# Patient Record
Sex: Female | Born: 1965
Health system: Southern US, Community
[De-identification: ages and names within clinical notes are randomized; demographics above are authoritative.]

## PROBLEM LIST (undated history)

## (undated) DIAGNOSIS — R112 Nausea with vomiting, unspecified: Secondary | ICD-10-CM

## (undated) DIAGNOSIS — M1990 Unspecified osteoarthritis, unspecified site: Secondary | ICD-10-CM

## (undated) DIAGNOSIS — C801 Malignant (primary) neoplasm, unspecified: Secondary | ICD-10-CM

## (undated) DIAGNOSIS — Z9889 Other specified postprocedural states: Secondary | ICD-10-CM

## (undated) HISTORY — PX: ABDOMINAL HYSTERECTOMY: SHX81

## (undated) HISTORY — PX: TUBAL LIGATION: SHX77

## (undated) HISTORY — PX: OTHER SURGICAL HISTORY: SHX169

## (undated) HISTORY — PX: KNEE ARTHROSCOPY: SUR90

---

## 1998-08-18 ENCOUNTER — Inpatient Hospital Stay (HOSPITAL_COMMUNITY): Admission: EM | Admit: 1998-08-18 | Discharge: 1998-08-22 | Payer: Self-pay | Admitting: Rheumatology

## 1998-08-19 ENCOUNTER — Encounter: Payer: Self-pay | Admitting: Rheumatology

## 1998-12-02 ENCOUNTER — Other Ambulatory Visit: Admission: RE | Admit: 1998-12-02 | Discharge: 1998-12-02 | Payer: Self-pay | Admitting: *Deleted

## 1999-02-08 ENCOUNTER — Inpatient Hospital Stay (HOSPITAL_COMMUNITY): Admission: RE | Admit: 1999-02-08 | Discharge: 1999-02-10 | Payer: Self-pay | Admitting: *Deleted

## 2000-01-08 ENCOUNTER — Other Ambulatory Visit: Admission: RE | Admit: 2000-01-08 | Discharge: 2000-01-08 | Payer: Self-pay | Admitting: *Deleted

## 2003-07-26 ENCOUNTER — Other Ambulatory Visit: Admission: RE | Admit: 2003-07-26 | Discharge: 2003-07-26 | Payer: Self-pay | Admitting: *Deleted

## 2004-07-25 ENCOUNTER — Other Ambulatory Visit: Admission: RE | Admit: 2004-07-25 | Discharge: 2004-07-25 | Payer: Self-pay | Admitting: *Deleted

## 2005-07-17 ENCOUNTER — Other Ambulatory Visit: Admission: RE | Admit: 2005-07-17 | Discharge: 2005-07-17 | Payer: Self-pay | Admitting: *Deleted

## 2006-04-26 ENCOUNTER — Ambulatory Visit: Payer: Self-pay | Admitting: Endocrinology

## 2006-06-17 ENCOUNTER — Ambulatory Visit: Payer: Self-pay | Admitting: Endocrinology

## 2006-07-03 ENCOUNTER — Ambulatory Visit: Payer: Self-pay | Admitting: Endocrinology

## 2006-07-17 ENCOUNTER — Other Ambulatory Visit: Admission: RE | Admit: 2006-07-17 | Discharge: 2006-07-17 | Payer: Self-pay | Admitting: *Deleted

## 2006-08-02 ENCOUNTER — Ambulatory Visit: Payer: Self-pay | Admitting: Endocrinology

## 2007-02-27 HISTORY — PX: FOOT SURGERY: SHX648

## 2007-05-29 ENCOUNTER — Encounter: Payer: Self-pay | Admitting: Endocrinology

## 2007-05-29 DIAGNOSIS — M069 Rheumatoid arthritis, unspecified: Secondary | ICD-10-CM | POA: Insufficient documentation

## 2007-05-29 DIAGNOSIS — E0590 Thyrotoxicosis, unspecified without thyrotoxic crisis or storm: Secondary | ICD-10-CM | POA: Insufficient documentation

## 2007-08-04 ENCOUNTER — Other Ambulatory Visit: Admission: RE | Admit: 2007-08-04 | Discharge: 2007-08-04 | Payer: Self-pay | Admitting: *Deleted

## 2008-08-11 ENCOUNTER — Other Ambulatory Visit: Admission: RE | Admit: 2008-08-11 | Discharge: 2008-08-11 | Payer: Self-pay | Admitting: Gynecology

## 2011-03-16 NOTE — Consult Note (Signed)
[facility]                            ENDOCRINOLOGY CONSULTATION   VONNIE, SPAGNOLO                        MRN:          [id number]  DATE:06/17/2006                            DOB:          06-25-1966    REASON FOR VISIT:  Follow-up thyroid.   HISTORY OF PRESENT ILLNESS:  A 45 year old old woman recently seen for  hyperthyroidism.  She states she is feeling better.  She has not had any  recent flu-like illness.  She has gained several pounds since her last  visit.   PAST MEDICAL HISTORY:  She has not recently had any iodinated contrast  agent.   REVIEW OF SYSTEMS:  She denies neck pain.   PHYSICAL EXAMINATION:  VITAL SIGNS:  Blood pressure is 105/68.  Heart rate  67.  Temperature is 97.6.  The weight is 163.  GENERAL:  No distress.  SKIN:  Not diaphoretic.  EYES:  No proptosis, no periorbital swelling.  NECK:  The thyroid is normal to my examination.   LABORATORY STUDIES:  Thyroid nuclear medicine scan done at [facility]  on May 21, 2006 shows a 24-hour uptake of 0.7%.  Thyroid function studies  on June 17, 2006:  TSH normal at 3.04.  Free T4 low normal at 0.6.   IMPRESSION:  1. Subacute (viral) thyroiditis.  2. In view of #1, it seems that she has gone from her hyperthyroid to her      euthyroid phase and will soon develop hypothyroidism.   PLAN:  I left a message for the patient suggesting she come back in a few  weeks.                                   Sean A. Everardo All, MD   SAE/MedQ  DD:  06/19/2006  DT:  06/19/2006  Job #:  [id number]   cc:   Aundra Dubin, MD  Almedia Balls. Randell Patient, MD

## 2014-03-01 ENCOUNTER — Other Ambulatory Visit: Payer: Self-pay | Admitting: Orthopedic Surgery

## 2014-03-16 ENCOUNTER — Encounter (HOSPITAL_COMMUNITY): Payer: Self-pay | Admitting: Pharmacy Technician

## 2014-03-19 ENCOUNTER — Encounter (HOSPITAL_COMMUNITY)
Admission: RE | Admit: 2014-03-19 | Discharge: 2014-03-19 | Disposition: A | Discharge status: Home / Self Care | Payer: Medicare Other | Source: Ambulatory Visit | Attending: Orthopedic Surgery | Admitting: Orthopedic Surgery

## 2014-03-19 ENCOUNTER — Ambulatory Visit (HOSPITAL_COMMUNITY)
Admission: RE | Admit: 2014-03-19 | Discharge: 2014-03-19 | Disposition: A | Discharge status: Home / Self Care | Payer: Medicare Other | Source: Ambulatory Visit | Attending: Orthopedic Surgery | Admitting: Orthopedic Surgery

## 2014-03-19 ENCOUNTER — Encounter (HOSPITAL_COMMUNITY): Payer: Self-pay

## 2014-03-19 DIAGNOSIS — Z01818 Encounter for other preprocedural examination: Secondary | ICD-10-CM | POA: Insufficient documentation

## 2014-03-19 DIAGNOSIS — Z01810 Encounter for preprocedural cardiovascular examination: Secondary | ICD-10-CM | POA: Insufficient documentation

## 2014-03-19 DIAGNOSIS — Z01812 Encounter for preprocedural laboratory examination: Secondary | ICD-10-CM | POA: Insufficient documentation

## 2014-03-19 HISTORY — DX: Other specified postprocedural states: Z98.890

## 2014-03-19 HISTORY — DX: Malignant (primary) neoplasm, unspecified: C80.1

## 2014-03-19 HISTORY — DX: Unspecified osteoarthritis, unspecified site: M19.90

## 2014-03-19 HISTORY — DX: Nausea with vomiting, unspecified: R11.2

## 2014-03-19 LAB — URINALYSIS, ROUTINE W REFLEX MICROSCOPIC
Bilirubin Urine: NEGATIVE
Glucose, UA: NEGATIVE mg/dL
Ketones, ur: NEGATIVE mg/dL
NITRITE: NEGATIVE
Protein, ur: NEGATIVE mg/dL
SPECIFIC GRAVITY, URINE: 1.009 (ref 1.005–1.030)
Urobilinogen, UA: 0.2 mg/dL (ref 0.0–1.0)
pH: 6 (ref 5.0–8.0)

## 2014-03-19 LAB — CBC WITH DIFFERENTIAL/PLATELET
BASOS PCT: 1 % (ref 0–1)
Basophils Absolute: 0 10*3/uL (ref 0.0–0.1)
EOS ABS: 0.1 10*3/uL (ref 0.0–0.7)
EOS PCT: 2 % (ref 0–5)
HCT: 37.3 % (ref 36.0–46.0)
HEMOGLOBIN: 12.4 g/dL (ref 12.0–15.0)
Lymphocytes Relative: 20 % (ref 12–46)
Lymphs Abs: 1.2 10*3/uL (ref 0.7–4.0)
MCH: 32.3 pg (ref 26.0–34.0)
MCHC: 33.2 g/dL (ref 30.0–36.0)
MCV: 97.1 fL (ref 78.0–100.0)
MONOS PCT: 7 % (ref 3–12)
Monocytes Absolute: 0.4 10*3/uL (ref 0.1–1.0)
NEUTROS PCT: 70 % (ref 43–77)
Neutro Abs: 4.1 10*3/uL (ref 1.7–7.7)
Platelets: 293 10*3/uL (ref 150–400)
RBC: 3.84 MIL/uL — ABNORMAL LOW (ref 3.87–5.11)
RDW: 14.5 % (ref 11.5–15.5)
WBC: 5.8 10*3/uL (ref 4.0–10.5)

## 2014-03-19 LAB — URINE MICROSCOPIC-ADD ON

## 2014-03-19 LAB — COMPREHENSIVE METABOLIC PANEL
ALBUMIN: 3.7 g/dL (ref 3.5–5.2)
ALK PHOS: 85 U/L (ref 39–117)
ALT: 17 U/L (ref 0–35)
AST: 22 U/L (ref 0–37)
BUN: 9 mg/dL (ref 6–23)
CALCIUM: 9.6 mg/dL (ref 8.4–10.5)
CO2: 24 mEq/L (ref 19–32)
CREATININE: 0.68 mg/dL (ref 0.50–1.10)
Chloride: 98 mEq/L (ref 96–112)
GFR calc non Af Amer: >90 mL/min (ref >90)
Glucose, Bld: 77 mg/dL (ref 70–99)
POTASSIUM: 4.2 meq/L (ref 3.7–5.3)
Sodium: 137 mEq/L (ref 137–147)
TOTAL PROTEIN: 7.7 g/dL (ref 6.0–8.3)
Total Bilirubin: <0.2 mg/dL — ABNORMAL LOW (ref 0.3–1.2)

## 2014-03-19 LAB — PROTIME-INR
INR: 0.93 (ref 0.00–1.49)
PROTHROMBIN TIME: 12.3 s (ref 11.6–15.2)

## 2014-03-19 LAB — TYPE AND SCREEN
ABO/RH(D): A POS
ANTIBODY SCREEN: NEGATIVE

## 2014-03-19 LAB — SURGICAL PCR SCREEN
MRSA, PCR: NEGATIVE
STAPHYLOCOCCUS AUREUS: POSITIVE — AB

## 2014-03-19 LAB — APTT: aPTT: 30 seconds (ref 24–37)

## 2014-03-19 LAB — ABO/RH: ABO/RH(D): A POS

## 2014-03-19 NOTE — Pre-Procedure Instructions (Addendum)
Jacqueline [name]  03/19/2014   Your procedure is scheduled on: 6.1.15  Report to [place] short stay admitting at 530 AM.  Call this number if you have problems the morning of surgery: [phone]   Remember:   Do not eat food or drink liquids after midnight.   Take these medicines the morning of surgery with A SIP OF WATER: , pain med if needed         Take all reqular meds until day of surgery except as below or per dr         Jacqueline [name] all herbel meds, nsaids (aleve,naproxen,advil,ibuprofen) 5 days prior to surgery (5.27.15) including all vitamins,folic acid, methotrexate,niacin   Do not wear jewelry, make-up or nail polish.  Do not wear lotions, powders, or perfumes. You may wear deodorant.  Do not shave 48 hours prior to surgery. Men may shave face and neck.  Do not bring valuables to the hospital.  [facility] is not responsible                  for any belongings or valuables.               Contacts, dentures or bridgework may not be worn into surgery.  Leave suitcase in the car. After surgery it may be brought to your room.  For patients admitted to the hospital, discharge time is determined by your                treatment team.               Patients discharged the day of surgery will not be allowed to drive  home.  Name and phone number of your driver:   Special Instructions:  Special Instructions: [place] - Preparing for Surgery  Before surgery, you can play an important role.  Because skin is not sterile, your skin needs to be as free of germs as possible.  You can reduce the number of germs on you skin by washing with CHG (chlorahexidine gluconate) soap before surgery.  CHG is an antiseptic cleaner which kills germs and bonds with the skin to continue killing germs even after washing.  Please DO NOT use if you have an allergy to CHG or antibacterial soaps.  If your skin becomes reddened/irritated stop using the CHG and inform your nurse when you arrive at Short  Stay.  Do not shave (including legs and underarms) for at least 48 hours prior to the first CHG shower.  You may shave your face.  Please follow these instructions carefully:   1.  Shower with CHG Soap the night before surgery and the morning of Surgery.  2.  If you choose to wash your hair, wash your hair first as usual with your normal shampoo.  3.  After you shampoo, rinse your hair and body thoroughly to remove the Shampoo.  4.  Use CHG as you would any other liquid soap.  You can apply chg directly  to the skin and wash gently with scrungie or a clean washcloth.  5.  Apply the CHG Soap to your body ONLY FROM THE NECK DOWN.  Do not use on open wounds or open sores.  Avoid contact with your eyes ears, mouth and genitals (private parts).  Wash genitals (private parts)       with your normal soap.  6.  Wash thoroughly, paying special attention to the area where your surgery will be performed.  7.  Thoroughly rinse  your body with warm water from the neck down.  8.  DO NOT shower/wash with your normal soap after using and rinsing off the CHG Soap.  9.  Pat yourself dry with a clean towel.            10.  Wear clean pajamas.            11.  Place clean sheets on your bed the night of your first shower and do not sleep with pets.  Day of Surgery  Do not apply any lotions/deodorants the morning of surgery.  Please wear clean clothes to the hospital/surgery center.   Please read over the following fact sheets that you were given: Pain Booklet, Coughing and Deep Breathing, Blood Transfusion Information, Total Joint Packet, MRSA Information and Surgical Site Infection Prevention

## 2014-03-21 LAB — URINE CULTURE

## 2014-03-28 MED ORDER — TRANEXAMIC ACID 100 MG/ML IV SOLN
1000.0000 mg | INTRAVENOUS | Status: AC
  Administered 2014-03-29: 1000 mg via INTRAVENOUS
  Filled 2014-03-28: qty 10

## 2014-03-28 MED ORDER — CEFAZOLIN SODIUM-DEXTROSE 2-3 GM-% IV SOLR
2.0000 g | INTRAVENOUS | Status: AC
  Administered 2014-03-29: 2 g via INTRAVENOUS
  Filled 2014-03-28: qty 50

## 2014-03-29 ENCOUNTER — Inpatient Hospital Stay (HOSPITAL_COMMUNITY)
Admission: RE | Admit: 2014-03-29 | Discharge: 2014-03-30 | DRG: 470 | Disposition: A | Discharge status: Home Health | Payer: Medicare Other | Source: Ambulatory Visit | Attending: Orthopedic Surgery | Admitting: Orthopedic Surgery

## 2014-03-29 ENCOUNTER — Encounter (HOSPITAL_COMMUNITY): Payer: Self-pay | Admitting: Surgery

## 2014-03-29 ENCOUNTER — Encounter (HOSPITAL_COMMUNITY): Payer: Medicare Other | Admitting: Certified Registered"

## 2014-03-29 ENCOUNTER — Encounter (HOSPITAL_COMMUNITY)
Admission: RE | Disposition: A | Discharge status: Home Health | Payer: Self-pay | Source: Ambulatory Visit | Attending: Orthopedic Surgery

## 2014-03-29 ENCOUNTER — Inpatient Hospital Stay (HOSPITAL_COMMUNITY): Payer: Medicare Other | Admitting: Certified Registered"

## 2014-03-29 DIAGNOSIS — Z79899 Other long term (current) drug therapy: Secondary | ICD-10-CM

## 2014-03-29 DIAGNOSIS — Z85820 Personal history of malignant melanoma of skin: Secondary | ICD-10-CM

## 2014-03-29 DIAGNOSIS — M1710 Unilateral primary osteoarthritis, unspecified knee: Principal | ICD-10-CM | POA: Diagnosis present

## 2014-03-29 DIAGNOSIS — Z96659 Presence of unspecified artificial knee joint: Secondary | ICD-10-CM

## 2014-03-29 DIAGNOSIS — D62 Acute posthemorrhagic anemia: Secondary | ICD-10-CM | POA: Diagnosis not present

## 2014-03-29 DIAGNOSIS — M069 Rheumatoid arthritis, unspecified: Secondary | ICD-10-CM | POA: Diagnosis present

## 2014-03-29 DIAGNOSIS — E0590 Thyrotoxicosis, unspecified without thyrotoxic crisis or storm: Secondary | ICD-10-CM | POA: Diagnosis present

## 2014-03-29 HISTORY — PX: TOTAL KNEE ARTHROPLASTY: SHX125

## 2014-03-29 LAB — CBC
HCT: 31 % — ABNORMAL LOW (ref 36.0–46.0)
Hemoglobin: 10.2 g/dL — ABNORMAL LOW (ref 12.0–15.0)
MCH: 31.9 pg (ref 26.0–34.0)
MCHC: 32.9 g/dL (ref 30.0–36.0)
MCV: 96.9 fL (ref 78.0–100.0)
PLATELETS: 383 10*3/uL (ref 150–400)
RBC: 3.2 MIL/uL — AB (ref 3.87–5.11)
RDW: 14 % (ref 11.5–15.5)
WBC: 8.6 10*3/uL (ref 4.0–10.5)

## 2014-03-29 LAB — CREATININE, SERUM
Creatinine, Ser: 0.64 mg/dL (ref 0.50–1.10)
GFR calc Af Amer: >90 mL/min (ref >90)
GFR calc non Af Amer: >90 mL/min (ref >90)

## 2014-03-29 SURGERY — ARTHROPLASTY, KNEE, TOTAL
Anesthesia: Regional | Site: Knee | Laterality: Left

## 2014-03-29 MED ORDER — FLEET ENEMA 7-19 GM/118ML RE ENEM: 1.0000 | ENEMA | Freq: Once | RECTAL | Status: AC | PRN

## 2014-03-29 MED ORDER — METOCLOPRAMIDE HCL 10 MG PO TABS
5.0000 mg | ORAL_TABLET | Freq: Three times a day (TID) | ORAL | Status: DC | PRN
  Administered 2014-03-29: 10 mg via ORAL
  Filled 2014-03-29: qty 1

## 2014-03-29 MED ORDER — CELECOXIB 200 MG PO CAPS
200.0000 mg | ORAL_CAPSULE | Freq: Two times a day (BID) | ORAL | Status: DC
  Administered 2014-03-29 – 2014-03-30 (×3): 200 mg via ORAL
  Filled 2014-03-29 (×6): qty 1

## 2014-03-29 MED ORDER — LIDOCAINE HCL (CARDIAC) 20 MG/ML IV SOLN
INTRAVENOUS | Status: AC
  Filled 2014-03-29: qty 5

## 2014-03-29 MED ORDER — TERBINAFINE HCL 250 MG PO TABS
250.0000 mg | ORAL_TABLET | Freq: Every day | ORAL | Status: DC
Start: 2014-03-29 — End: 2014-03-30
  Administered 2014-03-30: 250 mg via ORAL
  Filled 2014-03-29 (×2): qty 1

## 2014-03-29 MED ORDER — ONDANSETRON HCL 4 MG/2ML IJ SOLN
INTRAMUSCULAR | Status: AC
  Filled 2014-03-29: qty 2

## 2014-03-29 MED ORDER — DOCUSATE SODIUM 100 MG PO CAPS
100.0000 mg | ORAL_CAPSULE | Freq: Two times a day (BID) | ORAL | Status: DC
  Administered 2014-03-29 – 2014-03-30 (×3): 100 mg via ORAL
  Filled 2014-03-29 (×3): qty 1

## 2014-03-29 MED ORDER — FENTANYL CITRATE 0.05 MG/ML IJ SOLN
INTRAMUSCULAR | Status: AC
  Filled 2014-03-29: qty 5

## 2014-03-29 MED ORDER — METHOCARBAMOL 1000 MG/10ML IJ SOLN
500.0000 mg | Freq: Four times a day (QID) | INTRAVENOUS | Status: DC | PRN
  Filled 2014-03-29: qty 5

## 2014-03-29 MED ORDER — HYDROMORPHONE HCL PF 1 MG/ML IJ SOLN
0.5000 mg | INTRAMUSCULAR | Status: DC | PRN
  Administered 2014-03-29 (×2): 0.5 mg via INTRAVENOUS

## 2014-03-29 MED ORDER — BUPIVACAINE LIPOSOME 1.3 % IJ SUSP
20.0000 mL | Freq: Once | INTRAMUSCULAR | Status: DC
  Filled 2014-03-29 (×2): qty 20

## 2014-03-29 MED ORDER — PROPOFOL 10 MG/ML IV BOLUS
INTRAVENOUS | Status: DC | PRN
  Administered 2014-03-29: 200 mg via INTRAVENOUS

## 2014-03-29 MED ORDER — BUPIVACAINE-EPINEPHRINE (PF) 0.5% -1:200000 IJ SOLN
INTRAMUSCULAR | Status: DC | PRN
  Administered 2014-03-29: 25 mL

## 2014-03-29 MED ORDER — ACETAMINOPHEN 650 MG RE SUPP: 650.0000 mg | Freq: Four times a day (QID) | RECTAL | Status: DC | PRN

## 2014-03-29 MED ORDER — METHOTREXATE 2.5 MG PO TABS: 6.5000 mg | ORAL_TABLET | ORAL | Status: DC

## 2014-03-29 MED ORDER — LACTATED RINGERS IV SOLN
INTRAVENOUS | Status: DC | PRN
  Administered 2014-03-29: 07:00:00 via INTRAVENOUS

## 2014-03-29 MED ORDER — CHLORHEXIDINE GLUCONATE 4 % EX LIQD
60.0000 mL | Freq: Once | CUTANEOUS | Status: DC
Start: 2014-03-29 — End: 2014-03-29
  Filled 2014-03-29: qty 60

## 2014-03-29 MED ORDER — ONDANSETRON HCL 4 MG PO TABS: 4.0000 mg | ORAL_TABLET | Freq: Four times a day (QID) | ORAL | Status: DC | PRN

## 2014-03-29 MED ORDER — MIDAZOLAM HCL 2 MG/2ML IJ SOLN
INTRAMUSCULAR | Status: AC
  Filled 2014-03-29: qty 2

## 2014-03-29 MED ORDER — ONDANSETRON HCL 4 MG/2ML IJ SOLN
4.0000 mg | Freq: Four times a day (QID) | INTRAMUSCULAR | Status: DC | PRN
  Administered 2014-03-29: 4 mg via INTRAVENOUS
  Filled 2014-03-29: qty 2

## 2014-03-29 MED ORDER — ALUM & MAG HYDROXIDE-SIMETH 200-200-20 MG/5ML PO SUSP: 30.0000 mL | ORAL | Status: DC | PRN

## 2014-03-29 MED ORDER — CHLORHEXIDINE GLUCONATE 4 % EX LIQD
60.0000 mL | Freq: Once | CUTANEOUS | Status: DC
  Filled 2014-03-29: qty 60

## 2014-03-29 MED ORDER — HYDROMORPHONE HCL PF 1 MG/ML IJ SOLN
INTRAMUSCULAR | Status: AC
  Administered 2014-03-29: 0.5 mg via INTRAVENOUS
  Filled 2014-03-29: qty 1

## 2014-03-29 MED ORDER — BUPIVACAINE LIPOSOME 1.3 % IJ SUSP
INTRAMUSCULAR | Status: DC | PRN
  Administered 2014-03-29: 20 mL

## 2014-03-29 MED ORDER — PREDNISONE 10 MG PO TABS
10.0000 mg | ORAL_TABLET | Freq: Every day | ORAL | Status: DC
  Administered 2014-03-30: 10 mg via ORAL
  Filled 2014-03-29 (×2): qty 1

## 2014-03-29 MED ORDER — PHENOL 1.4 % MT LIQD: 1.0000 | OROMUCOSAL | Status: DC | PRN

## 2014-03-29 MED ORDER — BISACODYL 5 MG PO TBEC: 5.0000 mg | DELAYED_RELEASE_TABLET | Freq: Every day | ORAL | Status: DC | PRN

## 2014-03-29 MED ORDER — SCOPOLAMINE 1 MG/3DAYS TD PT72
MEDICATED_PATCH | TRANSDERMAL | Status: AC
  Filled 2014-03-29: qty 1

## 2014-03-29 MED ORDER — HYDROMORPHONE HCL PF 1 MG/ML IJ SOLN
0.2500 mg | INTRAMUSCULAR | Status: DC | PRN
  Administered 2014-03-29 (×4): 0.5 mg via INTRAVENOUS

## 2014-03-29 MED ORDER — ONDANSETRON HCL 4 MG/2ML IJ SOLN
INTRAMUSCULAR | Status: DC | PRN
  Administered 2014-03-29: 4 mg via INTRAVENOUS

## 2014-03-29 MED ORDER — ENOXAPARIN SODIUM 30 MG/0.3ML ~~LOC~~ SOLN
30.0000 mg | Freq: Two times a day (BID) | SUBCUTANEOUS | Status: DC
  Administered 2014-03-30: 30 mg via SUBCUTANEOUS
  Filled 2014-03-29 (×3): qty 0.3

## 2014-03-29 MED ORDER — HYDROCODONE-ACETAMINOPHEN 10-325 MG PO TABS
1.0000 | ORAL_TABLET | ORAL | Status: DC | PRN
  Administered 2014-03-29 – 2014-03-30 (×5): 2 via ORAL
  Filled 2014-03-29 (×5): qty 2

## 2014-03-29 MED ORDER — OXYCODONE HCL ER 20 MG PO T12A
20.0000 mg | EXTENDED_RELEASE_TABLET | Freq: Two times a day (BID) | ORAL | Status: DC
  Administered 2014-03-29 (×2): 20 mg via ORAL
  Filled 2014-03-29: qty 1
  Filled 2014-03-29: qty 2

## 2014-03-29 MED ORDER — SODIUM CHLORIDE 0.9 % IV SOLN
INTRAVENOUS | Status: DC
Start: 2014-03-29 — End: 2014-03-30

## 2014-03-29 MED ORDER — MENTHOL 3 MG MT LOZG: 1.0000 | LOZENGE | OROMUCOSAL | Status: DC | PRN

## 2014-03-29 MED ORDER — SODIUM CHLORIDE 0.9 % IR SOLN
Status: DC | PRN
  Administered 2014-03-29: 2000 mL

## 2014-03-29 MED ORDER — CEFAZOLIN SODIUM-DEXTROSE 2-3 GM-% IV SOLR
2.0000 g | Freq: Four times a day (QID) | INTRAVENOUS | Status: AC
  Administered 2014-03-29 (×2): 2 g via INTRAVENOUS
  Filled 2014-03-29 (×2): qty 50

## 2014-03-29 MED ORDER — HYDROMORPHONE HCL PF 1 MG/ML IJ SOLN
INTRAMUSCULAR | Status: AC
  Filled 2014-03-29: qty 1

## 2014-03-29 MED ORDER — ACETAMINOPHEN 325 MG PO TABS: 650.0000 mg | ORAL_TABLET | Freq: Four times a day (QID) | ORAL | Status: DC | PRN

## 2014-03-29 MED ORDER — HYDROMORPHONE HCL PF 1 MG/ML IJ SOLN
1.0000 mg | INTRAMUSCULAR | Status: DC | PRN
  Administered 2014-03-29 (×2): 1 mg via INTRAVENOUS
  Filled 2014-03-29 (×3): qty 1

## 2014-03-29 MED ORDER — METOCLOPRAMIDE HCL 5 MG/ML IJ SOLN: 5.0000 mg | Freq: Three times a day (TID) | INTRAMUSCULAR | Status: DC | PRN

## 2014-03-29 MED ORDER — PROPOFOL 10 MG/ML IV BOLUS
INTRAVENOUS | Status: AC
  Filled 2014-03-29: qty 20

## 2014-03-29 MED ORDER — SCOPOLAMINE 1 MG/3DAYS TD PT72
MEDICATED_PATCH | TRANSDERMAL | Status: DC | PRN
  Administered 2014-03-29: 1 via TRANSDERMAL

## 2014-03-29 MED ORDER — METHOCARBAMOL 500 MG PO TABS
500.0000 mg | ORAL_TABLET | Freq: Four times a day (QID) | ORAL | Status: DC | PRN
  Administered 2014-03-29 – 2014-03-30 (×4): 500 mg via ORAL
  Filled 2014-03-29 (×4): qty 1

## 2014-03-29 MED ORDER — DIPHENHYDRAMINE HCL 12.5 MG/5ML PO ELIX
12.5000 mg | ORAL_SOLUTION | ORAL | Status: DC | PRN
  Administered 2014-03-29 – 2014-03-30 (×5): 25 mg via ORAL
  Filled 2014-03-29 (×5): qty 10

## 2014-03-29 MED ORDER — BUPIVACAINE-EPINEPHRINE (PF) 0.5% -1:200000 IJ SOLN
INTRAMUSCULAR | Status: AC
  Filled 2014-03-29: qty 30

## 2014-03-29 MED ORDER — METHOTREXATE 2.5 MG PO TABS
7.5000 mg | ORAL_TABLET | ORAL | Status: DC
Start: 2014-04-02 — End: 2014-03-30

## 2014-03-29 MED ORDER — BUPIVACAINE-EPINEPHRINE 0.5% -1:200000 IJ SOLN
INTRAMUSCULAR | Status: DC | PRN
  Administered 2014-03-29: 30 mL

## 2014-03-29 MED ORDER — SENNOSIDES-DOCUSATE SODIUM 8.6-50 MG PO TABS
1.0000 | ORAL_TABLET | Freq: Every evening | ORAL | Status: DC | PRN
Start: 2014-03-29 — End: 2014-03-30

## 2014-03-29 MED ORDER — SODIUM CHLORIDE 0.9 % IV SOLN: INTRAVENOUS | Status: DC

## 2014-03-29 MED ORDER — ARTIFICIAL TEARS OP OINT
TOPICAL_OINTMENT | OPHTHALMIC | Status: AC
  Filled 2014-03-29: qty 3.5

## 2014-03-29 MED ORDER — FENTANYL CITRATE 0.05 MG/ML IJ SOLN
INTRAMUSCULAR | Status: DC | PRN
  Administered 2014-03-29: 100 ug via INTRAVENOUS
  Administered 2014-03-29 (×3): 50 ug via INTRAVENOUS
  Administered 2014-03-29: 100 ug via INTRAVENOUS

## 2014-03-29 MED ORDER — PROMETHAZINE HCL 25 MG/ML IJ SOLN: 6.2500 mg | INTRAMUSCULAR | Status: DC | PRN

## 2014-03-29 MED ORDER — ARTIFICIAL TEARS OP OINT
TOPICAL_OINTMENT | OPHTHALMIC | Status: DC | PRN
  Administered 2014-03-29: 1 via OPHTHALMIC

## 2014-03-29 MED ORDER — MIDAZOLAM HCL 5 MG/5ML IJ SOLN
INTRAMUSCULAR | Status: DC | PRN
  Administered 2014-03-29 (×2): 1 mg via INTRAVENOUS

## 2014-03-29 SURGICAL SUPPLY — 57 items
BANDAGE ESMARK 6X9 LF (GAUZE/BANDAGES/DRESSINGS) ×1 IMPLANT
BLADE SAGITTAL 13X1.27X60 (BLADE) ×2 IMPLANT
BLADE SAGITTAL 13X1.27X60MM (BLADE) ×1
BLADE SAW SGTL 83.5X18.5 (BLADE) ×3 IMPLANT
BNDG CMPR 9X6 STRL LF SNTH (GAUZE/BANDAGES/DRESSINGS) ×1
BNDG ESMARK 6X9 LF (GAUZE/BANDAGES/DRESSINGS) ×3
BOWL SMART MIX CTS (DISPOSABLE) ×3 IMPLANT
CAP POR NKTM CP VIT E LN CER ×2 IMPLANT
CEMENT BONE SIMPLEX SPEEDSET (Cement) ×6 IMPLANT
COVER SURGICAL LIGHT HANDLE (MISCELLANEOUS) ×3 IMPLANT
CUFF TOURNIQUET SINGLE 34IN LL (TOURNIQUET CUFF) ×3 IMPLANT
DRAPE EXTREMITY T 121X128X90 (DRAPE) ×3 IMPLANT
DRAPE INCISE IOBAN 66X45 STRL (DRAPES) ×6 IMPLANT
DRAPE PROXIMA HALF (DRAPES) ×3 IMPLANT
DRAPE U-SHAPE 47X51 STRL (DRAPES) ×3 IMPLANT
DRSG ADAPTIC 3X8 NADH LF (GAUZE/BANDAGES/DRESSINGS) ×3 IMPLANT
DRSG PAD ABDOMINAL 8X10 ST (GAUZE/BANDAGES/DRESSINGS) ×3 IMPLANT
DURAPREP 26ML APPLICATOR (WOUND CARE) ×6 IMPLANT
ELECT REM PT RETURN 9FT ADLT (ELECTROSURGICAL) ×3
ELECTRODE REM PT RTRN 9FT ADLT (ELECTROSURGICAL) ×1 IMPLANT
EVACUATOR 1/8 PVC DRAIN (DRAIN) ×3 IMPLANT
GLOVE BIOGEL M 7.0 STRL (GLOVE) IMPLANT
GLOVE BIOGEL PI IND STRL 7.5 (GLOVE) IMPLANT
GLOVE BIOGEL PI IND STRL 8.5 (GLOVE) ×2 IMPLANT
GLOVE BIOGEL PI INDICATOR 7.5 (GLOVE)
GLOVE BIOGEL PI INDICATOR 8.5 (GLOVE) ×4
GLOVE SURG ORTHO 8.0 STRL STRW (GLOVE) ×6 IMPLANT
GOWN STRL REUS W/ TWL LRG LVL3 (GOWN DISPOSABLE) ×2 IMPLANT
GOWN STRL REUS W/ TWL XL LVL3 (GOWN DISPOSABLE) ×2 IMPLANT
GOWN STRL REUS W/TWL LRG LVL3 (GOWN DISPOSABLE) ×6
GOWN STRL REUS W/TWL XL LVL3 (GOWN DISPOSABLE) ×6
HANDPIECE INTERPULSE COAX TIP (DISPOSABLE) ×3
HOOD PEEL AWAY FACE SHEILD DIS (HOOD) ×12 IMPLANT
KIT BASIN OR (CUSTOM PROCEDURE TRAY) ×3 IMPLANT
KIT ROOM TURNOVER OR (KITS) ×3 IMPLANT
MANIFOLD NEPTUNE II (INSTRUMENTS) ×3 IMPLANT
NEEDLE 22X1 1/2 (OR ONLY) (NEEDLE) ×6 IMPLANT
NS IRRIG 1000ML POUR BTL (IV SOLUTION) ×3 IMPLANT
PACK TOTAL JOINT (CUSTOM PROCEDURE TRAY) ×3 IMPLANT
PAD ARMBOARD 7.5X6 YLW CONV (MISCELLANEOUS) ×6 IMPLANT
PADDING CAST COTTON 6X4 STRL (CAST SUPPLIES) ×3 IMPLANT
SET HNDPC FAN SPRY TIP SCT (DISPOSABLE) ×1 IMPLANT
SPONGE GAUZE 4X4 12PLY (GAUZE/BANDAGES/DRESSINGS) ×3 IMPLANT
SPONGE GAUZE 4X4 12PLY STER LF (GAUZE/BANDAGES/DRESSINGS) ×2 IMPLANT
STAPLER VISISTAT 35W (STAPLE) ×3 IMPLANT
SUCTION FRAZIER TIP 10 FR DISP (SUCTIONS) ×3 IMPLANT
SUT BONE WAX W31G (SUTURE) ×3 IMPLANT
SUT VIC AB 0 CTB1 27 (SUTURE) ×6 IMPLANT
SUT VIC AB 1 CT1 27 (SUTURE) ×6
SUT VIC AB 1 CT1 27XBRD ANBCTR (SUTURE) ×2 IMPLANT
SUT VIC AB 2-0 CT1 27 (SUTURE) ×6
SUT VIC AB 2-0 CT1 TAPERPNT 27 (SUTURE) ×2 IMPLANT
SYR CONTROL 10ML LL (SYRINGE) ×6 IMPLANT
TOWEL OR 17X24 6PK STRL BLUE (TOWEL DISPOSABLE) ×3 IMPLANT
TOWEL OR 17X26 10 PK STRL BLUE (TOWEL DISPOSABLE) ×3 IMPLANT
TRAY FOLEY CATH 14FR (SET/KITS/TRAYS/PACK) ×3 IMPLANT
WATER STERILE IRR 1000ML POUR (IV SOLUTION) ×6 IMPLANT

## 2014-03-29 NOTE — Anesthesia Preprocedure Evaluation (Addendum)
Anesthesia Evaluation  Patient identified by MRN, date of birth, ID band Patient awake    Reviewed: Allergy & Precautions, H&P , NPO status , Patient's Chart, lab work & pertinent test results  History of Anesthesia Complications (+) PONV and history of anesthetic complications  Airway Mallampati: III TM Distance: <3 FB Neck ROM: Full    Dental  (+) Teeth Intact, Dental Advisory Given   Pulmonary neg pulmonary ROS,          Cardiovascular negative cardio ROS      Neuro/Psych negative neurological ROS  negative psych ROS   GI/Hepatic negative GI ROS, Neg liver ROS,   Endo/Other  Hyperthyroidism Morbid obesity  Renal/GU negative Renal ROS     Musculoskeletal  (+) Arthritis -,   Abdominal   Peds  Hematology   Anesthesia Other Findings   Reproductive/Obstetrics                          Anesthesia Physical Anesthesia Plan  ASA: II  Anesthesia Plan:    Post-op Pain Management:    Induction:   Airway Management Planned:   Additional Equipment:   Intra-op Plan:   Post-operative Plan:   Informed Consent:   Plan Discussed with: CRNA, Anesthesiologist and Surgeon  Anesthesia Plan Comments:         Anesthesia Quick Evaluation

## 2014-03-29 NOTE — Care Management Note (Signed)
CARE MANAGEMENT NOTE 03/29/2014  Patient:  Jacqueline [name], Jacqueline [name]   Account Number:  [id number]  Date Initiated:  03/29/2014  Documentation initiated by:  Ricki Miller  Subjective/Objective Assessment:   48 yr old female s/p left total knee arthroplasty.     Action/Plan:   PT/OT eval  Patient preoperatively setup with [facility]. Has family support at discharge. Case manager will follow.   Anticipated DC Date:  03/30/2014   Anticipated DC Plan:  [place]  CM consult      [facility]  HOME HEALTH  DURABLE MEDICAL EQUIPMENT   Choice offered to / List presented to:  C-1 Patient      DME agency  [employer]     [place] arranged  HH-2 PT      [facility] agency  [facility]   Status of service:  In process, will continue to follow

## 2014-03-29 NOTE — Op Note (Signed)
TOTAL KNEE REPLACEMENT OPERATIVE NOTE:  03/29/2014  3:02 PM  PATIENT:  Jacqueline [name]  48 y.o. female  PRE-OPERATIVE DIAGNOSIS:  osteoarthritis left knee  POST-OPERATIVE DIAGNOSIS:  osteoarthritis left knee  PROCEDURE:  Procedure(s): LEFT TOTAL KNEE ARTHROPLASTY  SURGEON:  Surgeon(s): Vickey Huger, MD  PHYSICIAN ASSISTANT: Carlynn Spry, [facility]  ANESTHESIA:   general  DRAINS: Hemovac  SPECIMEN: None  COUNTS:  Correct  TOURNIQUET:   Total Tourniquet Time Documented: Thigh (Left) - 55 minutes Total: Thigh (Left) - 55 minutes   DICTATION:  Indication for procedure:    The patient is a 48 y.o. female who has failed conservative treatment for osteoarthritis left knee.  Informed consent was obtained prior to anesthesia. The risks versus benefits of the operation were explain and in a way the patient can, and did, understand.   On the implant demand matching protocol, this patient scored 14.  Therefore, this patient was receive a polyethylene insert with vitamin E which is a high demand implant.  Description of procedure:     The patient was taken to the operating room and placed under anesthesia.  The patient was positioned in the usual fashion taking care that all body parts were adequately padded and/or protected.  I foley catheter was not placed.  A tourniquet was applied and the leg prepped and draped in the usual sterile fashion.  The extremity was exsanguinated with the esmarch and tourniquet inflated to 350 mmHg.  Pre-operative range of motion was 0-105  degrees flexion.  The knee was in 5 degree of mild varus.  A midline incision approximately 6-7 inches long was made with a #10 blade.  A new blade was used to make a parapatellar arthrotomy going 2-3 cm into the quadriceps tendon, over the patella, and alongside the medial aspect of the patellar tendon.  A synovectomy was then performed with the #10 blade and forceps. I then elevated the deep MCL off the medial tibial  metaphysis subperiosteally around to the semimembranosus attachment.    I everted the patella and used calipers to measure patellar thickness.  I used the reamer to ream down to appropriate thickness to recreate the native thickness.  I then removed excess bone with the rongeur and sagittal saw.  I used the appropriately sized template and drilled the three lug holes.  I then put the trial in place and measured the thickness with the calipers to ensure recreation of the native thickness.  The trial was then removed and the patella subluxed and the knee brought into flexion.  A homan retractor was place to retract and protect the patella and lateral structures.  A Z-retractor was place medially to protect the medial structures.  The extra-medullary alignment system was used to make cut the tibial articular surface perpendicular to the anamotic axis of the tibia and in 3 degrees of posterior slope.  The cut surface and alignment jig was removed.  I then used the intramedullary alignment guide to make a 5 valgus cut on the distal femur.  I then marked out the epicondylar axis on the distal femur.  The posterior condylar axis measured 3 degrees.  I then used the anterior referencing sizer and measured the femur to be a size 6.  The 4-In-1 cutting block was screwed into place in external rotation matching the posterior condylar angle, making our cuts perpendicular to the epicondylar axis.  Anterior, posterior and chamfer cuts were made with the sagittal saw.  The cutting block and cut pieces were  removed.  A lamina spreader was placed in 90 degrees of flexion.  The ACL, PCL, menisci, and posterior condylar osteophytes were removed.  A 10 mm spacer blocked was found to offer good flexion and extension gap balance after minimal in degree releasing.   The scoop retractor was then placed and the femoral finishing block was pinned in place.  The small sagittal saw was used as well as the lug drill to finish the femur.   The block and cut surfaces were removed and the medullary canal hole filled with autograft bone from the cut pieces.  The tibia was delivered forward in deep flexion and external rotation.  A size C tray was selected and pinned into place centered on the medial 1/3 of the tibial tubercle.  The reamer and keel was used to prepare the tibia through the tray.    I then trialed with the size 6 femur, size C tibia, a 10 mm insert and the 32 patella.  I had excellent flexion/extension gap balance, excellent patella tracking.  Flexion was full and beyond 120 degrees; extension was zero.  These components were chosen and the staff opened them to me on the back table while the knee was lavaged copiously and the cement mixed.  The soft tissue was infiltrated with 60cc of exparel 1.3% through a 21 gauge needle.  I cemented in the components and removed all excess cement.  The polyethylene tibial component was snapped into place and the knee placed in extension while cement was hardening.  The capsule was infilltrated with 30cc of .25% Marcaine with epinephrine.  A hemovac was place in the joint exiting superolaterally.  A pain pump was place superomedially superficial to the arthrotomy.  Once the cement was hard, the tourniquet was let down.  Hemostasis was obtained.  The arthrotomy was closed with figure-8 #1 vicryl sutures.  The deep soft tissues were closed with #0 vicryls and the subcuticular layer closed with a running #2-0 vicryl.  The skin was reapproximated and closed with skin staples.  The wound was dressed with xeroform, 4 x4's, 2 ABD sponges, a single layer of webril and a TED stocking.   The patient was then awakened, extubated, and taken to the recovery room in stable condition.  BLOOD LOSS:  300cc DRAINS: 1 hemovac, 1 pain catheter COMPLICATIONS:  None.  PLAN OF CARE: Admit to inpatient   PATIENT DISPOSITION:  PACU - hemodynamically stable.   Delay start of Pharmacological VTE agent (>24hrs)  due to surgical blood loss or risk of bleeding:  not applicable  Please fax a copy of this op note to my office at [phone] (please only include page 1 and 2 of the Case Information op note)

## 2014-03-29 NOTE — Evaluation (Signed)
Physical Therapy Evaluation Patient Details Name: TEIRRA CARAPIA MRN: [id number] DOB: 1966-08-03 Today's Date: 03/29/2014   History of Present Illness  48 y.o. female s/p left TKA. Hx of rheumatoid arthritis  Clinical Impression  Pt is s/p left TKA resulting in the deficits listed below (see PT Problem List). Ambulated 10 feet at min assist level on post-op day #0, demonstrating good stability in left knee. Pt will benefit from skilled PT to increase their independence and safety with mobility to allow discharge to the venue listed below.      Follow Up Recommendations Home health PT;Supervision/Assistance - 24 hour    Equipment Recommendations  None recommended by PT    Recommendations for Other Services OT consult     Precautions / Restrictions Precautions Precautions: Knee Precaution Comments: reviewed precautions verbally Restrictions Weight Bearing Restrictions: Yes LLE Weight Bearing: Weight bearing as tolerated      Mobility  Bed Mobility Overal bed mobility: Needs Assistance Bed Mobility: Supine to Sit     Supine to sit: Supervision     General bed mobility comments: supervision for safety with HOB flat and reliance of rail to pull herself up. verbal cues for technique and requires extra time  Transfers Overall transfer level: Needs assistance Equipment used: Rolling walker (2 wheeled) Transfers: Sit to/from Stand Sit to Stand: Min assist         General transfer comment: min assist to steady RW. Verbal cues for hand placement and upright posture once standing. Relies heavily on RW. States she has some right knee pain as well from her rheumatoid arthritis  Ambulation/Gait Ambulation/Gait assistance: Min assist Ambulation Distance (Feet): 10 Feet Assistive device: Rolling walker (2 wheeled) Gait Pattern/deviations: Step-to pattern;Decreased step length - right;Decreased stance time - left;Antalgic Gait velocity: decreased   General Gait Details:  Educated on left knee extension with quad activation while weight shifting in pre-gait activity with carry over to ambulating. Knee blocked initially by PT but pt progressed to stabilizing knee without assistance. No evidence of buckling. Very guarded gait; cues for sequencing of gait.  Stairs            Wheelchair Mobility    Modified Rankin (Stroke Patients Only)       Balance Overall balance assessment: Needs assistance Sitting-balance support: No upper extremity supported;Feet supported Sitting balance-Leahy Scale: Good Sitting balance - Comments: good limits of stability   Standing balance support: Single extremity supported Standing balance-Leahy Scale: Poor Standing balance comment: UE support to stand                             Pertinent Vitals/Pain 4/10 pain currently - states nurse just gave pain medication prior to PT session Patient repositioned in chair for comfort.     Home Living Family/patient expects to be discharged to:: Private residence Living Arrangements: Spouse/significant other Available Help at Discharge: Family;Available 24 hours/day Type of Home: House Home Access: Stairs to enter Entrance Stairs-Rails: Right Entrance Stairs-Number of Steps: 4 Home Layout: One level Home Equipment: Walker - 2 wheels;Crutches;Bedside commode;Shower seat      Prior Function Level of Independence: Needs assistance   Gait / Transfers Assistance Needed: used crutches for ambulation  ADL's / Homemaking Assistance Needed: Pt required assistance for bath/dress.        Hand Dominance   Dominant Hand: Right    Extremity/Trunk Assessment   Upper Extremity Assessment: Defer to OT evaluation  Lower Extremity Assessment: LLE deficits/detail   LLE Deficits / Details: decreased strength and ROM     Communication   Communication: No difficulties  Cognition Arousal/Alertness: Awake/alert Behavior During Therapy: WFL for tasks  assessed/performed Overall Cognitive Status: Within Functional Limits for tasks assessed                      General Comments General comments (skin integrity, edema, etc.): Reviewed precautions and WB status. Discussed role of PT and importance of being OOB to prevent secondary complications.    Exercises Total Joint Exercises Ankle Circles/Pumps: AROM;Both;10 reps;Seated Quad Sets: AROM;Both;10 reps;Seated      Assessment/Plan    PT Assessment Patient needs continued PT services  PT Diagnosis Difficulty walking;Abnormality of gait;Acute pain   PT Problem List Decreased strength;Decreased range of motion;Decreased activity tolerance;Decreased balance;Decreased mobility;Decreased knowledge of use of DME;Decreased knowledge of precautions;Pain  PT Treatment Interventions DME instruction;Gait training;Stair training;Functional mobility training;Therapeutic activities;Therapeutic exercise;Balance training;Neuromuscular re-education;Patient/family education;Modalities   PT Goals (Current goals can be found in the Care Plan section) Acute Rehab PT Goals Patient Stated Goal: Go home PT Goal Formulation: With patient Time For Goal Achievement: 04/05/14 Potential to Achieve Goals: Good    Frequency 7X/week   Barriers to discharge        Co-evaluation               End of Session Equipment Utilized During Treatment: Gait belt Activity Tolerance: Patient tolerated treatment well Patient left: in chair;with call bell/phone within reach;with family/visitor present Nurse Communication: Mobility status         Time: 1445-1515 PT Time Calculation (min): 30 min   Charges:   PT Evaluation $Initial PT Evaluation Tier I: 1 Procedure PT Treatments $Gait Training: 8-22 mins   PT G CodesCamille Bal [place], [place] 03/29/2014, 4:34 PM

## 2014-03-29 NOTE — Transfer of Care (Signed)
Immediate Anesthesia Transfer of Care Note  Patient: Jacqueline [name]  Procedure(s) Performed: Procedure(s): LEFT TOTAL KNEE ARTHROPLASTY (Left)  Patient Location: PACU  Anesthesia Type:General  Level of Consciousness: awake, alert  and oriented  Airway & Oxygen Therapy: Patient Spontanous Breathing and Patient connected to nasal cannula oxygen  Post-op Assessment: Report given to PACU RN  Post vital signs: Reviewed and stable  Complications: No apparent anesthesia complications

## 2014-03-29 NOTE — Anesthesia Procedure Notes (Addendum)
Anesthesia Regional Block:  Adductor canal block  Pre-Anesthetic Checklist: ,, timeout performed, Correct Patient, Correct Site, Correct Laterality, Correct Procedure, Correct Position, site marked, Risks and benefits discussed,  Surgical consent,  Pre-op evaluation,  At surgeon's request and post-op pain management  Laterality: Left  Prep: chloraprep       Needles:  Injection technique: Single-shot  Needle Type: Stimulator Needle - 80     Needle Length: 10cm 10 cm Needle Gauge: 21 and 21 G    Additional Needles:  Procedures: ultrasound guided (picture in chart) Adductor canal block Narrative:  Start time: 03/29/2014 7:07 AM End time: 03/29/2014 7:17 AM Injection made incrementally with aspirations every 5 mL.  Performed by: Personally  Anesthesiologist: Earnest Bailey, MD   Procedure Name: LMA Insertion Date/Time: 03/29/2014 7:41 AM Performed by: Barrington Ellison Pre-anesthesia Checklist: Patient identified, Emergency Drugs available, Suction available, Patient being monitored and Timeout performed Patient Re-evaluated:Patient Re-evaluated prior to inductionOxygen Delivery Method: Circle system utilized Preoxygenation: Pre-oxygenation with 100% oxygen Intubation Type: IV induction Ventilation: Mask ventilation without difficulty LMA: LMA inserted LMA Size: 4.0 Number of attempts: 1 Placement Confirmation: positive ETCO2 and breath sounds checked- equal and bilateral Tube secured with: Tape Dental Injury: Teeth and Oropharynx as per pre-operative assessment

## 2014-03-29 NOTE — Progress Notes (Signed)
Orthopedic Tech Progress Note Patient Details:  Jacqueline [name] [birth year] [id number] CPM applied to LLE with appropriate settings. OHF applied to bed. Footsie roll provided.  CPM Left Knee CPM Left Knee: On Left Knee Flexion (Degrees): 90 Left Knee Extension (Degrees): 0   Asia R Thompson 03/29/2014, 10:19 AM

## 2014-03-29 NOTE — H&P (Signed)
Jacqueline [name] MRN:  [id number] DOB/SEX:  [birth year]/female  CHIEF COMPLAINT:  Painful left Knee  HISTORY: Patient is a 48 y.o. female presented with a history of pain in the left knee. Onset of symptoms was gradual starting several years ago with rapidly worsening course since that time. Prior procedures on the knee include none. Patient has been treated conservatively with over-the-counter NSAIDs and activity modification. Patient currently rates pain in the knee at 10 out of 10 with activity. There is pain at night.  PAST MEDICAL HISTORY: Patient Active Problem List   Diagnosis Date Noted  . HYPERTHYROIDISM 05/29/2007  . RHEUMATOID ARTHRITIS 05/29/2007   Past Medical History  Diagnosis Date  . PONV (postoperative nausea and vomiting)     usually needs patch  . Arthritis     rheumatoid  . Cancer     melanoma rem rt shoulder   Past Surgical History  Procedure Laterality Date  . Melonama Right     shoulder  . Foot surgery Left 05,08    screws,pins, rt foot 06  . Abdominal hysterectomy      partial  . Tubal ligation    . Knee arthroscopy Right     cartilage     MEDICATIONS:   Prescriptions prior to admission  Medication Sig Dispense Refill  . Calcium Carb-Cholecalciferol (CALCIUM 600 + D PO) Take 1 tablet by mouth daily.      . folic acid (FOLVITE) 297 MCG tablet Take 400 mcg by mouth daily.      Marland Kitchen HYDROcodone-acetaminophen (NORCO) 10-325 MG per tablet Take 1 tablet by mouth 2 (two) times daily as needed for moderate pain.      Marland Kitchen ibuprofen (ADVIL,MOTRIN) 200 MG tablet Take 800 mg by mouth every 6 (six) hours as needed for fever, headache or mild pain.      . methotrexate (RHEUMATREX) 2.5 MG tablet Take 6.5 mg by mouth 2 (two) times a week. Caution:Chemotherapy. Protect from light.      . niacin 500 MG tablet Take 500 mg by mouth at bedtime.      . predniSONE (DELTASONE) 10 MG tablet Take 10 mg by mouth daily with breakfast.      . terbinafine (LAMISIL) 250 MG tablet  Take 250 mg by mouth daily.      . traMADol (ULTRAM) 50 MG tablet Take 50 mg by mouth every 6 (six) hours as needed.        ALLERGIES:   Allergies  Allergen Reactions  . Codeine Nausea And Vomiting  . Oxycodone-Acetaminophen Itching    REVIEW OF SYSTEMS:  Pertinent items are noted in HPI.   FAMILY HISTORY:  History reviewed. No pertinent family history.  SOCIAL HISTORY:   History  Substance Use Topics  . Smoking status: Never Smoker   . Smokeless tobacco: Not on file  . Alcohol Use: No     EXAMINATION:  Vital signs in last 24 hours: Temp:  [98.3 F (36.8 C)] 98.3 F (36.8 C) (06/01 0548) Pulse Rate:  [81] 81 (06/01 0548) Resp:  [20] 20 (06/01 0548) BP: (131)/(56) 131/56 mmHg (06/01 0548) SpO2:  [100 %] 100 % (06/01 0548) Weight:  [85.73 kg (189 lb)] 85.73 kg (189 lb) (06/01 0548)  General appearance: alert, cooperative and no distress Lungs: clear to auscultation bilaterally Heart: regular rate and rhythm, S1, S2 normal, no murmur, click, rub or gallop Abdomen: soft, non-tender; bowel sounds normal; no masses,  no organomegaly Extremities: extremities normal, atraumatic, no cyanosis or edema and Homans sign  is negative, no sign of DVT Pulses: 2+ and symmetric Skin: Skin color, texture, turgor normal. No rashes or lesions Neurologic: Alert and oriented X 3, normal strength and tone. Normal symmetric reflexes. Normal coordination and gait  Musculoskeletal:  ROM 0-115, Ligaments intact,  Imaging Review Plain radiographs demonstrate severe degenerative joint disease of the left knee. The overall alignment is significant valgus. The bone quality appears to be good for age and reported activity level.  Assessment/Plan: End stage arthritis, left knee   The patient history, physical examination and imaging studies are consistent with advanced degenerative joint disease of the left knee. The patient has failed conservative treatment.  The clearance notes were reviewed.   After discussion with the patient it was felt that Total Knee Replacement was indicated. The procedure,  risks, and benefits of total knee arthroplasty were presented and reviewed. The risks including but not limited to aseptic loosening, infection, blood clots, vascular injury, stiffness, patella tracking problems complications among others were discussed. The patient acknowledged the explanation, agreed to proceed with the plan.  Carlynn Spry 03/29/2014, 6:24 AM

## 2014-03-29 NOTE — Anesthesia Postprocedure Evaluation (Signed)
  Anesthesia Post-op Note  Patient: Jacqueline [name]  Procedure(s) Performed: Procedure(s): LEFT TOTAL KNEE ARTHROPLASTY (Left)  Patient Location: PACU  Anesthesia Type:General and GA combined with regional for post-op pain  Level of Consciousness: awake, alert  and oriented  Airway and Oxygen Therapy: Patient Spontanous Breathing and Patient connected to nasal cannula oxygen  Post-op Pain: moderate  Post-op Assessment: Post-op Vital signs reviewed  Post-op Vital Signs: Reviewed  Last Vitals:  Filed Vitals:   03/29/14 1022  BP: 133/58  Pulse: 89  Temp:   Resp: 19    Complications: No apparent anesthesia complications

## 2014-03-29 NOTE — Progress Notes (Signed)
Utilization review completed.  

## 2014-03-29 NOTE — Evaluation (Signed)
Occupational Therapy Evaluation Patient Details Name: Jacqueline [name] MRN: [id number] DOB: 09-May-1966 Today's Date: 03/29/2014    History of Present Illness 48 y.o. female s/p left TKA. Hx of rheumatoid arthritis   Clinical Impression   Pt presents with below problem list. Feel pt will benefit from acute OT to increase independence prior to d/c and to also help strengthen RUE.     Follow Up Recommendations  Home health OT;Supervision - Intermittent    Equipment Recommendations  None recommended by OT    Recommendations for Other Services       Precautions / Restrictions Precautions Precautions: Knee Precaution Comments: reviewed precautions Restrictions Weight Bearing Restrictions: Yes LLE Weight Bearing: Weight bearing as tolerated      Mobility Bed Mobility Overal bed mobility: Needs Assistance Bed Mobility: Supine to Sit;Sit to Supine     Supine to sit: Supervision Sit to supine: Supervision      Transfers Overall transfer level: Needs assistance Equipment used: Rolling walker (2 wheeled) Transfers: Sit to/from Stand Sit to Stand: Min assist;Min guard         General transfer comment: cues for technique/hand placement.    Balance                                            ADL Overall ADL's : Needs assistance/impaired     Grooming: Wash/dry face;Oral care;Wash/dry hands;Sitting;Standing;Set up;Supervision/safety               Lower Body Dressing: Minimal assistance;Sit to/from stand   Toilet Transfer: Min guard;Ambulation;RW (3 in 1 over commode)           Functional mobility during ADLs: Min guard;Rolling walker General ADL Comments: Educated benefit to reach down to left foot for socks/shoes as it increases ROM in knee. Educated on AE for LB ADLs and pt practiced with sockaid. Educated on dressing technique and also explained that button up shirts or shirts with large neck size may be easier for pt to manage. Also,  mentioned button hook due to pt reporting having difficulty with buttons. Educated on exercise for RUE. Educated on use of bag on walker, safe shoewear, and rugs.  Educated on tub transfer technique. Explained purpose of footsie roll.     Vision                     Perception     Praxis      Pertinent Vitals/Pain Pain 5/10. Repositioned.      Hand Dominance Right   Extremity/Trunk Assessment Upper Extremity Assessment Upper Extremity Assessment: RUE deficits/detail RUE Deficits / Details: rhematoid arthritis; painful with shoulder flexion   Lower Extremity Assessment Lower Extremity Assessment: Defer to PT evaluation       Communication Communication Communication: No difficulties   Cognition Arousal/Alertness: Awake/alert Behavior During Therapy: WFL for tasks assessed/performed Overall Cognitive Status: Within Functional Limits for tasks assessed                     General Comments       Exercises Exercises: Other exercises Other Exercises Other Exercises: shoulder flexion AAROM-educated and demonstrated   Shoulder Instructions      Home Living Family/patient expects to be discharged to:: Private residence Living Arrangements: Spouse/significant other Available Help at Discharge: Family;Available 24 hours/day Type of Home: House Home Access: Stairs to enter [employer] of  Steps: 4 Entrance Stairs-Rails: Right Home Layout: One level     Bathroom Shower/Tub: Teacher, early years/pre: Standard     Home Equipment: Walker - 2 wheels;Crutches;Bedside commode;Shower seat          Prior Functioning/Environment Level of Independence: Needs assistance  Gait / Transfers Assistance Needed: used crutches for ambulation ADL's / Homemaking Assistance Needed: Pt required assistance for bath/dress.        OT Diagnosis: Acute pain   OT Problem List: Decreased strength;Pain;Impaired UE functional use;Decreased knowledge of  precautions;Decreased knowledge of use of DME or AE;Decreased range of motion   OT Treatment/Interventions: Self-care/ADL training;Therapeutic exercise;DME and/or AE instruction;Therapeutic activities;Patient/family education;Balance training    OT Goals(Current goals can be found in the care plan section) Acute Rehab OT Goals Patient Stated Goal: not stated OT Goal Formulation: With patient Time For Goal Achievement: 04/05/14 Potential to Achieve Goals: Good ADL Goals Pt Will Perform Lower Body Dressing: with modified independence;sit to/from stand Pt Will Transfer to Toilet: with modified independence;ambulating (3 in 1 over commode) Additional ADL Goal #1: Pt will independently perform HEP for bilateral UE's to increase strength.  OT Frequency: Min 2X/week   Barriers to D/C:            Co-evaluation              End of Session Equipment Utilized During Treatment: Gait belt;Rolling walker CPM Left Knee CPM Left Knee: Off  Activity Tolerance: Patient tolerated treatment well Patient left: in bed;with call bell/phone within reach;with family/visitor present   Time: 6073-7106 OT Time Calculation (min): 28 min Charges:  OT General Charges $OT Visit: 1 Procedure OT Evaluation $Initial OT Evaluation Tier I: 1 Procedure OT Treatments $Self Care/Home Management : 8-22 mins G-Codes:    Benito Mccreedy OTR/L [phone] 03/29/2014, 6:25 PM

## 2014-03-30 ENCOUNTER — Encounter (HOSPITAL_COMMUNITY): Payer: Self-pay | Admitting: Orthopedic Surgery

## 2014-03-30 LAB — BASIC METABOLIC PANEL
BUN: 12 mg/dL (ref 6–23)
CO2: 23 meq/L (ref 19–32)
CREATININE: 1.02 mg/dL (ref 0.50–1.10)
Calcium: 8.9 mg/dL (ref 8.4–10.5)
Chloride: 95 mEq/L — ABNORMAL LOW (ref 96–112)
GFR calc non Af Amer: 65 mL/min — ABNORMAL LOW (ref >90)
GFR, EST AFRICAN AMERICAN: 75 mL/min — AB (ref >90)
Glucose, Bld: 118 mg/dL — ABNORMAL HIGH (ref 70–99)
Potassium: 4 mEq/L (ref 3.7–5.3)
Sodium: 133 mEq/L — ABNORMAL LOW (ref 137–147)

## 2014-03-30 LAB — CBC
HEMATOCRIT: 30.8 % — AB (ref 36.0–46.0)
Hemoglobin: 10.2 g/dL — ABNORMAL LOW (ref 12.0–15.0)
MCH: 31.7 pg (ref 26.0–34.0)
MCHC: 33.1 g/dL (ref 30.0–36.0)
MCV: 95.7 fL (ref 78.0–100.0)
Platelets: 393 10*3/uL (ref 150–400)
RBC: 3.22 MIL/uL — ABNORMAL LOW (ref 3.87–5.11)
RDW: 14 % (ref 11.5–15.5)
WBC: 6.9 10*3/uL (ref 4.0–10.5)

## 2014-03-30 MED ORDER — HYDROCODONE-ACETAMINOPHEN 10-325 MG PO TABS: 1.0000 | ORAL_TABLET | ORAL | Status: DC | PRN

## 2014-03-30 MED ORDER — ASPIRIN EC 325 MG PO TBEC: 325.0000 mg | DELAYED_RELEASE_TABLET | Freq: Every day | ORAL | Status: DC

## 2014-03-30 MED ORDER — METHOCARBAMOL 500 MG PO TABS: 500.0000 mg | ORAL_TABLET | Freq: Four times a day (QID) | ORAL | Status: DC | PRN

## 2014-03-30 NOTE — Discharge Instructions (Signed)
Diet: As you were doing prior to hospitalization   Activity:  Increase activity slowly as tolerated                  No lifting or driving for 6 weeks  Shower:  May shower without a dressing once there is no drainage from your wound.                 Do NOT wash over the wound.                 Dressing:  You may change your dressing on Wednesday                    Then change the dressing daily with sterile 4"x4"s gauze dressing                     And TED hose for knees.  Weight Bearing:  Weight bearing as tolerated as taught in physical therapy.  Use a                                walker or Crutches as instructed.  To prevent constipation: you may use a stool softener such as -               Colace ( over the counter) 100 mg by mouth twice a day                Drink plenty of fluids ( prune juice may be helpful) and high fiber foods                Miralax ( over the counter) for constipation as needed.    Precautions:  If you experience chest pain or shortness of breath - call 911 immediately               For transfer to the hospital emergency department!!               If you develop a fever greater that 101 F, purulent drainage from wound,                             increased redness or drainage from wound, or calf pain -- Call the office.  Follow- Up Appointment:  Please call for an appointment to be seen on 04/13/14                                              [facility] office:  [phone]            [address] [place], [place] [address]

## 2014-03-30 NOTE — Progress Notes (Signed)
Physical Therapy Treatment Patient Details Name: Jacqueline [name] MRN: [id number] DOB: Oct 08, 1966 Today's Date: 03/30/2014    History of Present Illness 48 y.o. female s/p left TKA. Hx of rheumatoid arthritis    PT Comments    Patient progressing well. Able to complete stair training. Husband present throughout  Follow Up Recommendations  Home health PT;Supervision/Assistance - 24 hour     Equipment Recommendations  None recommended by PT    Recommendations for Other Services       Precautions / Restrictions Precautions Precautions: Knee Precaution Comments: reviewed precautions Restrictions Weight Bearing Restrictions: Yes LLE Weight Bearing: Weight bearing as tolerated    Mobility  Bed Mobility               General bed mobility comments: not assessed  Transfers Overall transfer level: Modified independent Equipment used: Rolling walker (2 wheeled) Transfers: Sit to/from Stand Sit to Stand: Min guard;Supervision            Ambulation/Gait Ambulation/Gait assistance: Supervision Ambulation Distance (Feet): 200 Feet Assistive device: Rolling walker (2 wheeled) Gait Pattern/deviations: Step-through pattern         Stairs Stairs: Yes Stairs assistance: Min guard Stair Management: Step to pattern;Forwards;With crutches;One rail Right Number of Stairs: 2 General stair comments: Patient able to complete stair training with no issues  Wheelchair Mobility    Modified Rankin (Stroke Patients Only)       Balance                                    Cognition Arousal/Alertness: Awake/alert Behavior During Therapy: WFL for tasks assessed/performed Overall Cognitive Status: Within Functional Limits for tasks assessed                      Exercises Total Joint Exercises Quad Sets: AROM;Both;10 reps;Seated Heel Slides: AAROM;Left;10 reps Hip ABduction/ADduction: AROM;Left;10 reps Straight Leg Raises: AROM;Left;10  reps Long Arc Quad: AAROM;Left;10 reps Other Exercises Other Exercises: scapular strengthening exercises on wall.    General Comments        Pertinent Vitals/Pain no apparent distress     Home Living                      Prior Function            PT Goals (current goals can now be found in the care plan section) Acute Rehab PT Goals Patient Stated Goal: not stated Progress towards PT goals: Progressing toward goals    Frequency  7X/week    PT Plan Current plan remains appropriate    Co-evaluation             End of Session Equipment Utilized During Treatment: Gait belt Activity Tolerance: Patient tolerated treatment well Patient left: in chair;with call bell/phone within reach;with family/visitor present     Time: 4196-2229 PT Time Calculation (min): 29 min  Charges:  $Gait Training: 8-22 mins $Therapeutic Exercise: 8-22 mins                    G Codes:      Tonia Brooms Robinette 03/30/2014, 3:09 PM 03/30/2014 [place] PTA [phone] pager [phone] office

## 2014-03-30 NOTE — Progress Notes (Addendum)
Occupational Therapy Treatment Patient Details Name: MARGARINE GROSSHANS MRN: [id number] DOB: 18-Dec-1965 Today's Date: 03/30/2014    History of present illness 48 y.o. female s/p left TKA. Hx of rheumatoid arthritis   OT comments  Pt progressing. Performed UE exercises and ADLs. Spouse present for session.  Follow Up Recommendations  Home health OT;Supervision - Intermittent    Equipment Recommendations  None recommended by OT    Recommendations for Other Services      Precautions / Restrictions Precautions Precautions: Knee Precaution Comments: reviewed precautions Restrictions Weight Bearing Restrictions: Yes LLE Weight Bearing: Weight bearing as tolerated       Mobility Bed Mobility               General bed mobility comments: not assessed  Transfers Overall transfer level: Needs assistance Equipment used: Rolling walker (2 wheeled) Transfers: Sit to/from Stand Sit to Stand: Min guard;Supervision              Balance                                   ADL Overall ADL's : Needs assistance/impaired                     Lower Body Dressing: Min guard;Sit to/from stand;With adaptive equipment   Toilet Transfer: Min guard;Ambulation;RW (3 in 1 over commode)           Functional mobility during ADLs: Min guard;Rolling walker General ADL Comments: Practiced with sockaid/reacher. Educated on UE exercises for scapular strengthening. Reviewed safety tips.  Explained benefit of reaching down to left sock.      Vision                     Perception     Praxis      Cognition   Behavior During Therapy: [facility] for tasks assessed/performed Overall Cognitive Status: Within Functional Limits for tasks assessed                       Extremity/Trunk Assessment               Exercises Other Exercises Other Exercises: scapular strengthening exercises on wall.   Shoulder Instructions       General Comments       Pertinent Vitals/ Pain       Pain indicated but not rated. Repositioned.   Home Living                                          Prior Functioning/Environment              Frequency Min 2X/week     Progress Toward Goals  OT Goals(current goals can now be found in the care plan section)  Progress towards OT goals: Progressing toward goals  Acute Rehab OT Goals Patient Stated Goal: not stated OT Goal Formulation: With patient Time For Goal Achievement: 04/05/14 Potential to Achieve Goals: Good ADL Goals Pt Will Perform Lower Body Dressing: with modified independence;sit to/from stand Pt Will Transfer to Toilet: with modified independence;ambulating (3 in 1 over commode) Additional ADL Goal #1: Pt will independently perform HEP for bilateral UE's to increase strength.  Plan Discharge plan remains appropriate    Co-evaluation  End of Session Equipment Utilized During Treatment: Gait belt;Rolling walker   Activity Tolerance Patient tolerated treatment well   Patient Left in chair;with family/visitor present   Nurse Communication          Time: 3254-9826 OT Time Calculation (min): 18 min  Charges: OT General Charges $OT Visit: 1 Procedure OT Treatments $Self Care/Home Management : 8-22 mins  Benito Mccreedy  OTR/L [phone]  03/30/2014, 2:33 PM

## 2014-03-30 NOTE — Progress Notes (Signed)
Physical Therapy Treatment Patient Details Name: Jacqueline [name] MRN: [id number] DOB: [birth year] Today's Date: 03/30/2014    History of Present Illness 48 y.o. female s/p left TKA. Hx of rheumatoid arthritis    PT Comments    Patient progressing with mobility this morning. Will plan to review stair training this afternoon.   Follow Up Recommendations  Home health PT;Supervision/Assistance - 24 hour     Equipment Recommendations  None recommended by PT    Recommendations for Other Services       Precautions / Restrictions Precautions Precautions: Knee Restrictions LLE Weight Bearing: Weight bearing as tolerated    Mobility  Bed Mobility Overal bed mobility: Needs Assistance       Supine to sit: Min assist     General bed mobility comments: Min A for LLE.   Transfers Overall transfer level: Needs assistance Equipment used: Rolling walker (2 wheeled) Transfers: Sit to/from Stand Sit to Stand: Min guard         General transfer comment: cues for technique/hand placement.  Ambulation/Gait Ambulation/Gait assistance: Min guard Ambulation Distance (Feet): 140 Feet Assistive device: Rolling walker (2 wheeled) Gait Pattern/deviations: Step-to pattern;Decreased step length - left;Decreased step length - right Gait velocity: decreased   General Gait Details: Patient cued for upright posture throughout. Changed RW to youth size. No buckling noted    Stairs            Wheelchair Mobility    Modified Rankin (Stroke Patients Only)       Balance                                    Cognition Arousal/Alertness: Awake/alert Behavior During Therapy: WFL for tasks assessed/performed Overall Cognitive Status: Within Functional Limits for tasks assessed                      Exercises Total Joint Exercises Quad Sets: AROM;Both;10 reps;Seated Heel Slides: AAROM;Left;10 reps Hip ABduction/ADduction: AROM;Left;10 reps Straight Leg  Raises: AROM;Left;10 reps Long Arc Quad: AAROM;Left;10 reps    General Comments        Pertinent Vitals/Pain no apparent distress     Home Living                      Prior Function            PT Goals (current goals can now be found in the care plan section) Progress towards PT goals: Progressing toward goals    Frequency  7X/week    PT Plan Current plan remains appropriate    Co-evaluation             End of Session Equipment Utilized During Treatment: Gait belt Activity Tolerance: Patient tolerated treatment well Patient left: in chair;with call bell/phone within reach;with family/visitor present     Time: 8127-5170 PT Time Calculation (min): 26 min  Charges:  $Gait Training: 8-22 mins $Therapeutic Exercise: 8-22 mins                    G Codes:      Tonia Brooms Correy Weidner 03/30/2014, 9:47 AM 03/30/2014 [place] PTA [phone] pager [phone] office

## 2014-03-30 NOTE — Care Management Note (Signed)
CARE MANAGEMENT NOTE 03/30/2014  Patient:  Jacqueline [name], Jacqueline [name]   Account Number:  [id number]  Date Initiated:  03/29/2014  Documentation initiated by:  Ricki Miller  Subjective/Objective Assessment:   48 yr old female s/p left total knee arthroplasty.     Action/Plan:   PT/OT eval  Case manager spoke with pateint and husband concerning home health and DME needs.Patient preoperatively setup with [facility]no changes. Has family support at discharge.   Anticipated DC Date:  03/30/2014   Anticipated DC Plan:  [place]  CM consult      [facility] Choice  HOME HEALTH  DURABLE MEDICAL EQUIPMENT   Choice offered to / List presented to:  C-1 Patient      DME agency  [employer]     [place] arranged  HH-2 PT      [facility] agency  [facility]   Status of service:  Completed, signed off Medicare Important Message given?  YES (If response is "NO", the following Medicare IM given date fields will be blank) Date Medicare IM given:  03/30/2014 Date Additional Medicare IM given:    Discharge Disposition:  [place]

## 2014-03-30 NOTE — Progress Notes (Signed)
SPORTS MEDICINE AND JOINT REPLACEMENT  Lara Mulch, MD   Carlynn Spry, PA-C [place], [place], [place]  [address]                             [phone]   PROGRESS NOTE  Subjective:  negative for Chest Pain  negative for Shortness of Breath  negative for Nausea/Vomiting   negative for Calf Pain  negative for Bowel Movement   Tolerating Diet: yes         Patient reports pain as 4 on 0-10 scale.    Objective: Vital signs in last 24 hours:   Patient Vitals for the past 24 hrs:  BP Temp Temp src Pulse Resp SpO2 Height Weight  03/30/14 0419 119/69 mmHg 98.2 F (36.8 C) Oral 80 16 100 % - -  03/30/14 0205 - 100.1 F (37.8 C) Oral - - - - -  03/30/14 0039 112/59 mmHg 100.8 F (38.2 C) Oral 93 16 95 % - -  03/29/14 2104 133/71 mmHg 99 F (37.2 C) Oral 90 16 95 % - -  03/29/14 1614 - - - - - - 5\' 2"  (1.575 m) 85.73 kg (189 lb)  03/29/14 1206 115/55 mmHg 98.2 F (36.8 C) Oral 87 14 96 % - -  03/29/14 1100 - 97.9 F (36.6 C) - - - - - -  03/29/14 1053 130/62 mmHg - - 85 15 96 % - -  03/29/14 1038 139/57 mmHg - - 89 18 97 % - -  03/29/14 1022 133/58 mmHg - - 89 19 97 % - -  03/29/14 1007 140/73 mmHg - - 88 22 95 % - -  03/29/14 0952 131/68 mmHg - - 86 19 97 % - -  03/29/14 0937 112/51 mmHg - - 81 21 97 % - -  03/29/14 0922 122/63 mmHg - - 85 21 96 % - -  03/29/14 0919 - 98.5 F (36.9 C) - - - - - -    @flow {1959:LAST@   Intake/Output from previous day:   06/01 0701 - 06/02 0700 In: 1320 [P.O.:720; I.V.:600] Out: 150 [Drains:50]   Intake/Output this shift:       Intake/Output     06/01 0701 - 06/02 0700 06/02 0701 - 06/03 0700   P.O. 720    I.V. (mL/kg) 600 (7)    Total Intake(mL/kg) 1320 (15.4)    Drains 50    Blood 100    Total Output 150     Net +1170          Urine Occurrence 1 x       LABORATORY DATA:  Recent Labs  03/29/14 1319 03/30/14 0625  WBC 8.6 6.9  HGB 10.2* 10.2*  HCT 31.0* 30.8*  PLT 383 393    Recent Labs   03/29/14 1319 03/30/14 0625  NA  --  133*  K  --  4.0  CL  --  95*  CO2  --  23  BUN  --  12  CREATININE 0.64 1.02  GLUCOSE  --  118*  CALCIUM  --  8.9   Lab Results  Component Value Date   INR 0.93 03/19/2014    Examination:  General appearance: alert, cooperative and no distress Extremities: Homans sign is negative, no sign of DVT  Wound Exam: clean, dry, intact   Drainage:  Scant/small amount Serosanguinous exudate  Motor Exam: EHL and FHL Intact  Sensory Exam: Deep Peroneal  normal   Assessment:    1 Day Post-Op  Procedure(s) (LRB): LEFT TOTAL KNEE ARTHROPLASTY (Left)  ADDITIONAL DIAGNOSIS:  Active Problems:   S/P total knee arthroplasty  Acute Blood Loss Anemia   Plan: Physical Therapy as ordered Weight Bearing as Tolerated (WBAT)  DVT Prophylaxis:  Lovenox  DISCHARGE PLAN: Home  DISCHARGE NEEDS: HHPT, CPM, Walker and 3-in-1 comode seat         Carlynn Spry 03/30/2014, 8:27 AM

## 2014-04-12 NOTE — Discharge Summary (Signed)
SPORTS MEDICINE & JOINT REPLACEMENT   Jacqueline Mulch, MD   Jacqueline Spry, PA-C [place], [place], [place]  [address]                             [phone]  PATIENT ID: Jacqueline [name]        MRN:  [id number]          DOB/AGE: 46-22-1967 / 46 y.o.    DISCHARGE SUMMARY  ADMISSION DATE:    03/29/2014 DISCHARGE DATE:  03/30/2014  ADMISSION DIAGNOSIS: osteoarthritis left knee    DISCHARGE DIAGNOSIS:  osteoarthritis left knee    ADDITIONAL DIAGNOSIS: Active Problems:   S/P total knee arthroplasty  Past Medical History  Diagnosis Date  . PONV (postoperative nausea and vomiting)     usually needs patch  . Arthritis     rheumatoid  . Cancer     melanoma rem rt shoulder    PROCEDURE: Procedure(s): LEFT TOTAL KNEE ARTHROPLASTY on 03/29/2014  CONSULTS:     HISTORY:  See H&P in chart  HOSPITAL COURSE:  Jacqueline [name] is a 48 y.o. admitted on 03/29/2014 and found to have a diagnosis of osteoarthritis left knee.  After appropriate laboratory studies were obtained  they were taken to the operating room on 03/29/2014 and underwent Procedure(s): LEFT TOTAL KNEE ARTHROPLASTY.   They were given perioperative antibiotics:  Anti-infectives   Start     Dose/Rate Route Frequency Ordered Stop   03/29/14 1300  terbinafine (LAMISIL) tablet 250 mg  Status:  Discontinued     250 mg Oral Daily 03/29/14 1146 03/30/14 1823   03/29/14 1300  ceFAZolin (ANCEF) IVPB 2 g/50 mL premix     2 g 100 mL/hr over 30 Minutes Intravenous Every 6 hours 03/29/14 1146 03/29/14 2021   03/29/14 0600  ceFAZolin (ANCEF) IVPB 2 g/50 mL premix     2 g 100 mL/hr over 30 Minutes Intravenous On call to O.R. 03/28/14 1410 03/29/14 0742    .  Tolerated the procedure well.  Placed with a foley intraoperatively.  Given Ofirmev at induction and for 48 hours.    POD# 1: Vital signs were stable.  Patient denied Chest pain, shortness of breath, or calf pain.  Patient was started on Lovenox 30 mg subcutaneously twice  daily at 8am.  Consults to PT, OT, and care management were made.  The patient was weight bearing as tolerated.  CPM was placed on the operative leg 0-90 degrees for 6-8 hours a day.  Incentive spirometry was taught.  Dressing was changed.  Marcaine pump and hemovac were discontinued.      POD #2, Continued  PT for ambulation and exercise program.  IV saline locked.  O2 discontinued.    The remainder of the hospital course was dedicated to ambulation and strengthening.   The patient was discharged on post op day 1 in  Good condition.  Blood products given:none  DIAGNOSTIC STUDIES: Recent vital signs: No data found.      Recent laboratory studies: No results found for this basename: WBC, HGB, HCT, PLT,  in the last 168 hours No results found for this basename: NA, K, CL, CO2, BUN, CREATININE, GLUCOSE, CALCIUM,  in the last 168 hours Lab Results  Component Value Date   INR 0.93 03/19/2014     Recent Radiographic Studies :  Dg Chest 2 View  03/19/2014   CLINICAL DATA:  Preop left knee.  EXAM:  CHEST  2 VIEW  COMPARISON:  None.  FINDINGS: Lungs are adequately inflated and otherwise clear. Cardiomediastinal silhouette is within normal. There are moderate degenerative changes of the right glenohumeral joint and AC joints. Severe decreased acromiohumeral joint space.  IMPRESSION: No acute cardiopulmonary disease.   Electronically Signed   By: Marin Olp M.D.   On: 03/19/2014 11:09    DISCHARGE INSTRUCTIONS: Discharge Instructions   CPM    Complete by:  As directed   Continuous passive motion machine (CPM):      Use the CPM from 0 to 90 for 6-8 hours per day.      You may increase by 10 per day.  You may break it up into 2 or 3 sessions per day.      Use CPM for 2 weeks or until you are told to stop.     Call MD / Call 911    Complete by:  As directed   If you experience chest pain or shortness of breath, CALL 911 and be transported to the hospital emergency room.  If you develope a  fever above 101 F, pus (white drainage) or increased drainage or redness at the wound, or calf pain, call your surgeon's office.     Change dressing    Complete by:  As directed   Change dressing on Wednesday, then change the dressing daily with sterile 4 x 4 inch gauze dressing and apply TED hose.     Constipation Prevention    Complete by:  As directed   Drink plenty of fluids.  Prune juice may be helpful.  You may use a stool softener, such as Colace (over the counter) 100 mg twice a day.  Use MiraLax (over the counter) for constipation as needed.     Diet - low sodium heart healthy    Complete by:  As directed      Do not put a pillow under the knee. Place it under the heel.    Complete by:  As directed      Driving restrictions    Complete by:  As directed   No driving for 6 weeks     Increase activity slowly as tolerated    Complete by:  As directed      Lifting restrictions    Complete by:  As directed   No lifting for 6 weeks     TED hose    Complete by:  As directed   Use stockings (TED hose) for 3 weeks on both leg(s).  You may remove them at night for sleeping.           DISCHARGE MEDICATIONS:     Medication List    STOP taking these medications       folic acid 063 MCG tablet  Commonly known as:  FOLVITE     traMADol 50 MG tablet  Commonly known as:  ULTRAM      TAKE these medications       aspirin EC 325 MG tablet  Take 1 tablet (325 mg total) by mouth daily.     aspirin EC 325 MG tablet  Take 1 tablet (325 mg total) by mouth daily.     CALCIUM 600 + D PO  Take 1 tablet by mouth daily.     HYDROcodone-acetaminophen 10-325 MG per tablet  Commonly known as:  NORCO  Take 1-2 tablets by mouth every 4 (four) hours as needed (breakthrough pain).     ibuprofen 200 MG tablet  Commonly known as:  ADVIL,MOTRIN  Take 800 mg by mouth every 6 (six) hours as needed for fever, headache or mild pain.     methocarbamol 500 MG tablet  Commonly known as:  ROBAXIN   Take 1-2 tablets (500-1,000 mg total) by mouth every 6 (six) hours as needed for muscle spasms.     methotrexate 2.5 MG tablet  Commonly known as:  RHEUMATREX  Take 7.5 mg by mouth 2 (two) times a week. Friday & Saturday - Caution:Chemotherapy. Protect from light.     niacin 500 MG tablet  Take 500 mg by mouth at bedtime.     predniSONE 10 MG tablet  Commonly known as:  DELTASONE  Take 10 mg by mouth daily with breakfast.     terbinafine 250 MG tablet  Commonly known as:  LAMISIL  Take 250 mg by mouth daily.        FOLLOW UP VISIT:       Follow-up Information   Follow up with [facility]. (Someone from [facility] will contact you concerning start date and time for physical therapy.)    Contact information:   [address] [place] [address] [phone]       Follow up with Rudean Haskell, MD. Call on 04/13/2014.   Specialty:  Orthopedic Surgery   Contact information:   [address] [place] [address] [phone]       DISPOSITION: HOME   CONDITION:  Good   Mollee Neer 04/12/2014, 7:07 AM

## 2014-04-13 DIAGNOSIS — M25561 Pain in right knee: Secondary | ICD-10-CM | POA: Insufficient documentation

## 2014-05-19 ENCOUNTER — Other Ambulatory Visit: Payer: Self-pay | Admitting: Orthopedic Surgery

## 2014-06-30 ENCOUNTER — Encounter (HOSPITAL_COMMUNITY): Payer: Self-pay | Admitting: Pharmacy Technician

## 2014-07-02 ENCOUNTER — Encounter (HOSPITAL_COMMUNITY): Payer: Self-pay

## 2014-07-02 ENCOUNTER — Encounter (HOSPITAL_COMMUNITY)
Admission: RE | Admit: 2014-07-02 | Discharge: 2014-07-02 | Disposition: A | Discharge status: Home / Self Care | Payer: Medicare Other | Source: Ambulatory Visit | Attending: Orthopedic Surgery | Admitting: Orthopedic Surgery

## 2014-07-02 DIAGNOSIS — Z01818 Encounter for other preprocedural examination: Secondary | ICD-10-CM | POA: Insufficient documentation

## 2014-07-02 DIAGNOSIS — M1710 Unilateral primary osteoarthritis, unspecified knee: Secondary | ICD-10-CM | POA: Diagnosis present

## 2014-07-02 LAB — URINALYSIS, ROUTINE W REFLEX MICROSCOPIC
Bilirubin Urine: NEGATIVE
Glucose, UA: NEGATIVE mg/dL
HGB URINE DIPSTICK: NEGATIVE
Ketones, ur: NEGATIVE mg/dL
LEUKOCYTES UA: NEGATIVE
NITRITE: NEGATIVE
Protein, ur: NEGATIVE mg/dL
SPECIFIC GRAVITY, URINE: 1.018 (ref 1.005–1.030)
UROBILINOGEN UA: 0.2 mg/dL (ref 0.0–1.0)
pH: 5 (ref 5.0–8.0)

## 2014-07-02 LAB — APTT: APTT: 26 s (ref 24–37)

## 2014-07-02 LAB — TYPE AND SCREEN
ABO/RH(D): A POS
ANTIBODY SCREEN: NEGATIVE

## 2014-07-02 LAB — COMPREHENSIVE METABOLIC PANEL
ALBUMIN: 3.9 g/dL (ref 3.5–5.2)
ALT: 22 U/L (ref 0–35)
ANION GAP: 14 (ref 5–15)
AST: 23 U/L (ref 0–37)
Alkaline Phosphatase: 75 U/L (ref 39–117)
BUN: 14 mg/dL (ref 6–23)
CALCIUM: 9.3 mg/dL (ref 8.4–10.5)
CHLORIDE: 101 meq/L (ref 96–112)
CO2: 24 mEq/L (ref 19–32)
Creatinine, Ser: 0.69 mg/dL (ref 0.50–1.10)
GFR calc Af Amer: >90 mL/min (ref >90)
GFR calc non Af Amer: >90 mL/min (ref >90)
Glucose, Bld: 86 mg/dL (ref 70–99)
Potassium: 4 mEq/L (ref 3.7–5.3)
Sodium: 139 mEq/L (ref 137–147)
Total Bilirubin: <0.2 mg/dL — ABNORMAL LOW (ref 0.3–1.2)
Total Protein: 7.9 g/dL (ref 6.0–8.3)

## 2014-07-02 LAB — CBC WITH DIFFERENTIAL/PLATELET
BASOS ABS: 0.1 10*3/uL (ref 0.0–0.1)
BASOS PCT: 1 % (ref 0–1)
Eosinophils Absolute: 0.2 10*3/uL (ref 0.0–0.7)
Eosinophils Relative: 1 % (ref 0–5)
HEMATOCRIT: 41.3 % (ref 36.0–46.0)
HEMOGLOBIN: 13.8 g/dL (ref 12.0–15.0)
Lymphocytes Relative: 15 % (ref 12–46)
Lymphs Abs: 1.9 10*3/uL (ref 0.7–4.0)
MCH: 31.4 pg (ref 26.0–34.0)
MCHC: 33.4 g/dL (ref 30.0–36.0)
MCV: 93.9 fL (ref 78.0–100.0)
MONO ABS: 0.6 10*3/uL (ref 0.1–1.0)
Monocytes Relative: 5 % (ref 3–12)
NEUTROS ABS: 9.4 10*3/uL — AB (ref 1.7–7.7)
NEUTROS PCT: 78 % — AB (ref 43–77)
Platelets: 302 10*3/uL (ref 150–400)
RBC: 4.4 MIL/uL (ref 3.87–5.11)
RDW: 14.3 % (ref 11.5–15.5)
WBC: 12.1 10*3/uL — ABNORMAL HIGH (ref 4.0–10.5)

## 2014-07-02 LAB — SURGICAL PCR SCREEN
MRSA, PCR: NEGATIVE
STAPHYLOCOCCUS AUREUS: POSITIVE — AB

## 2014-07-02 LAB — PROTIME-INR
INR: 1 (ref 0.00–1.49)
Prothrombin Time: 13.2 seconds (ref 11.6–15.2)

## 2014-07-02 NOTE — Pre-Procedure Instructions (Signed)
Jacqueline [name]  07/02/2014   Your procedure is scheduled on:  07/12/2014  Report to [facility] Admitting at 5:30 AM.  Call this number if you have problems the morning of surgery: [phone]   Remember:   Do not eat food or drink liquids after midnight.  On Sunday evening    Take these medicines the morning of surgery with A SIP OF WATER: TRAMADOL   Do not wear jewelry, make-up or nail polish.  Do not wear lotions, powders, or perfumes. You may wear deodorant.  Do not shave 48 hours prior to surgery.   Do not bring valuables to the hospital.  [facility] is not responsible   for any belongings or valuables.               Contacts, dentures or bridgework may not be worn into surgery.  Leave suitcase in the car. After surgery it may be brought to your room.  For patients admitted to the hospital, discharge time is determined by your                treatment team.               Patients discharged the day of surgery will not be allowed to drive  home.  Name and phone number of your driver: with spouse  Special Instructions:  Special Instructions: [facility] - Preparing for Surgery  Before surgery, you can play an important role.  Because skin is not sterile, your skin needs to be as free of germs as possible.  You can reduce the number of germs on you skin by washing with CHG (chlorahexidine gluconate) soap before surgery.  CHG is an antiseptic cleaner which kills germs and bonds with the skin to continue killing germs even after washing.  Please DO NOT use if you have an allergy to CHG or antibacterial soaps.  If your skin becomes reddened/irritated stop using the CHG and inform your nurse when you arrive at Short Stay.  Do not shave (including legs and underarms) for at least 48 hours prior to the first CHG shower.  You may shave your face.  Please follow these instructions carefully:   1.  Shower with CHG Soap the night before surgery and the  morning of Surgery.  2.   If you choose to wash your hair, wash your hair first as usual with your  normal shampoo.  3.  After you shampoo, rinse your hair and body thoroughly to remove the  Shampoo.  4.  Use CHG as you would any other liquid soap.  You can apply chg directly to the skin and wash gently with scrungie or a clean washcloth.  5.  Apply the CHG Soap to your body ONLY FROM THE NECK DOWN.    Do not use on open wounds or open sores.  Avoid contact with your eyes, ears, mouth and genitals (private parts).  Wash genitals (private parts)   with your normal soap.  6.  Wash thoroughly, paying special attention to the area where your surgery will be performed.  7.  Thoroughly rinse your body with warm water from the neck down.  8.  DO NOT shower/wash with your normal soap after using and rinsing off   the CHG Soap.  9.  Pat yourself dry with a clean towel.            10 .  Wear clean pajamas.  11.  Place clean sheets on your bed the night of your first shower and do not sleep with pets.  Day of Surgery  Do not apply any lotions/deodorants the morning of surgery.  Please wear clean clothes to the hospital/surgery center.    Please read over the following fact sheets that you were given: Pain Booklet, Coughing and Deep Breathing, Blood Transfusion Information, MRSA Information and Surgical Site Infection Prevention

## 2014-07-04 LAB — URINE CULTURE

## 2014-07-11 MED ORDER — CEFAZOLIN SODIUM-DEXTROSE 2-3 GM-% IV SOLR
2.0000 g | INTRAVENOUS | Status: AC
  Administered 2014-07-12: 2 g via INTRAVENOUS
  Filled 2014-07-11: qty 50

## 2014-07-11 MED ORDER — BUPIVACAINE LIPOSOME 1.3 % IJ SUSP
20.0000 mL | Freq: Once | INTRAMUSCULAR | Status: DC
  Filled 2014-07-11: qty 20

## 2014-07-11 MED ORDER — CHLORHEXIDINE GLUCONATE 4 % EX LIQD
60.0000 mL | Freq: Once | CUTANEOUS | Status: DC
  Filled 2014-07-11: qty 60

## 2014-07-11 MED ORDER — TRANEXAMIC ACID 100 MG/ML IV SOLN
1000.0000 mg | INTRAVENOUS | Status: AC
  Administered 2014-07-12: 1000 mg via INTRAVENOUS
  Filled 2014-07-11: qty 10

## 2014-07-11 MED ORDER — SODIUM CHLORIDE 0.9 % IV SOLN
INTRAVENOUS | Status: DC
  Administered 2014-07-12: 75 mL/h via INTRAVENOUS

## 2014-07-12 ENCOUNTER — Encounter (HOSPITAL_COMMUNITY): Payer: Medicare Other | Admitting: Anesthesiology

## 2014-07-12 ENCOUNTER — Inpatient Hospital Stay (HOSPITAL_COMMUNITY): Payer: Medicare Other | Admitting: Anesthesiology

## 2014-07-12 ENCOUNTER — Encounter (HOSPITAL_COMMUNITY)
Admission: RE | Disposition: A | Discharge status: Home Health | Payer: Self-pay | Source: Ambulatory Visit | Attending: Orthopedic Surgery

## 2014-07-12 ENCOUNTER — Inpatient Hospital Stay (HOSPITAL_COMMUNITY)
Admission: RE | Admit: 2014-07-12 | Discharge: 2014-07-13 | DRG: 470 | Disposition: A | Discharge status: Home Health | Payer: Medicare Other | Source: Ambulatory Visit | Attending: Orthopedic Surgery | Admitting: Orthopedic Surgery

## 2014-07-12 ENCOUNTER — Encounter (HOSPITAL_COMMUNITY): Payer: Self-pay | Admitting: *Deleted

## 2014-07-12 DIAGNOSIS — M069 Rheumatoid arthritis, unspecified: Secondary | ICD-10-CM | POA: Diagnosis present

## 2014-07-12 DIAGNOSIS — Z885 Allergy status to narcotic agent status: Secondary | ICD-10-CM

## 2014-07-12 DIAGNOSIS — D62 Acute posthemorrhagic anemia: Secondary | ICD-10-CM | POA: Diagnosis not present

## 2014-07-12 DIAGNOSIS — E669 Obesity, unspecified: Secondary | ICD-10-CM | POA: Diagnosis present

## 2014-07-12 DIAGNOSIS — M1710 Unilateral primary osteoarthritis, unspecified knee: Principal | ICD-10-CM | POA: Diagnosis present

## 2014-07-12 DIAGNOSIS — E0590 Thyrotoxicosis, unspecified without thyrotoxic crisis or storm: Secondary | ICD-10-CM | POA: Diagnosis present

## 2014-07-12 DIAGNOSIS — Z96651 Presence of right artificial knee joint: Secondary | ICD-10-CM

## 2014-07-12 DIAGNOSIS — IMO0002 Reserved for concepts with insufficient information to code with codable children: Secondary | ICD-10-CM | POA: Diagnosis not present

## 2014-07-12 DIAGNOSIS — Z96659 Presence of unspecified artificial knee joint: Secondary | ICD-10-CM

## 2014-07-12 DIAGNOSIS — Z6834 Body mass index (BMI) 34.0-34.9, adult: Secondary | ICD-10-CM

## 2014-07-12 DIAGNOSIS — Z79899 Other long term (current) drug therapy: Secondary | ICD-10-CM

## 2014-07-12 HISTORY — PX: TOTAL KNEE ARTHROPLASTY: SHX125

## 2014-07-12 LAB — CREATININE, SERUM
Creatinine, Ser: 0.6 mg/dL (ref 0.50–1.10)
GFR calc non Af Amer: >90 mL/min (ref >90)

## 2014-07-12 LAB — CBC
HEMATOCRIT: 32 % — AB (ref 36.0–46.0)
Hemoglobin: 10.4 g/dL — ABNORMAL LOW (ref 12.0–15.0)
MCH: 30.4 pg (ref 26.0–34.0)
MCHC: 32.5 g/dL (ref 30.0–36.0)
MCV: 93.6 fL (ref 78.0–100.0)
Platelets: 265 10*3/uL (ref 150–400)
RBC: 3.42 MIL/uL — ABNORMAL LOW (ref 3.87–5.11)
RDW: 14.2 % (ref 11.5–15.5)
WBC: 9.4 10*3/uL (ref 4.0–10.5)

## 2014-07-12 SURGERY — ARTHROPLASTY, KNEE, TOTAL
Anesthesia: General | Site: Knee | Laterality: Right

## 2014-07-12 MED ORDER — METHOTREXATE 2.5 MG PO TABS: 7.5000 mg | ORAL_TABLET | ORAL | Status: DC

## 2014-07-12 MED ORDER — DEXAMETHASONE SODIUM PHOSPHATE 4 MG/ML IJ SOLN
INTRAMUSCULAR | Status: AC
  Filled 2014-07-12: qty 1

## 2014-07-12 MED ORDER — ALUM & MAG HYDROXIDE-SIMETH 200-200-20 MG/5ML PO SUSP: 30.0000 mL | ORAL | Status: DC | PRN

## 2014-07-12 MED ORDER — SODIUM CHLORIDE 0.9 % IV SOLN
INTRAVENOUS | Status: DC
  Administered 2014-07-12: 23:00:00 via INTRAVENOUS

## 2014-07-12 MED ORDER — SODIUM CHLORIDE 0.9 % IR SOLN
Status: DC | PRN
  Administered 2014-07-12: 1000 mL

## 2014-07-12 MED ORDER — METOCLOPRAMIDE HCL 5 MG/ML IJ SOLN: 5.0000 mg | Freq: Three times a day (TID) | INTRAMUSCULAR | Status: DC | PRN

## 2014-07-12 MED ORDER — ACETAMINOPHEN 325 MG PO TABS: 650.0000 mg | ORAL_TABLET | Freq: Four times a day (QID) | ORAL | Status: DC | PRN

## 2014-07-12 MED ORDER — ROCURONIUM BROMIDE 50 MG/5ML IV SOLN
INTRAVENOUS | Status: AC
  Filled 2014-07-12: qty 1

## 2014-07-12 MED ORDER — BISACODYL 5 MG PO TBEC
5.0000 mg | DELAYED_RELEASE_TABLET | Freq: Every day | ORAL | Status: DC | PRN
Start: 2014-07-12 — End: 2014-07-13

## 2014-07-12 MED ORDER — ENOXAPARIN SODIUM 30 MG/0.3ML ~~LOC~~ SOLN
30.0000 mg | Freq: Two times a day (BID) | SUBCUTANEOUS | Status: DC
  Administered 2014-07-13: 30 mg via SUBCUTANEOUS
  Filled 2014-07-12 (×3): qty 0.3

## 2014-07-12 MED ORDER — MIDAZOLAM HCL 2 MG/2ML IJ SOLN
INTRAMUSCULAR | Status: AC
  Filled 2014-07-12: qty 2

## 2014-07-12 MED ORDER — FENTANYL CITRATE 0.05 MG/ML IJ SOLN
INTRAMUSCULAR | Status: AC
  Filled 2014-07-12: qty 5

## 2014-07-12 MED ORDER — DEXTROSE 5 % IV SOLN
500.0000 mg | Freq: Four times a day (QID) | INTRAVENOUS | Status: DC | PRN
  Administered 2014-07-12: 500 mg via INTRAVENOUS
  Filled 2014-07-12: qty 5

## 2014-07-12 MED ORDER — FLEET ENEMA 7-19 GM/118ML RE ENEM: 1.0000 | ENEMA | Freq: Once | RECTAL | Status: AC | PRN

## 2014-07-12 MED ORDER — MENTHOL 3 MG MT LOZG
1.0000 | LOZENGE | OROMUCOSAL | Status: DC | PRN
  Administered 2014-07-12: 3 mg via ORAL
  Filled 2014-07-12: qty 9

## 2014-07-12 MED ORDER — CEFAZOLIN SODIUM-DEXTROSE 2-3 GM-% IV SOLR
2.0000 g | Freq: Four times a day (QID) | INTRAVENOUS | Status: AC
  Administered 2014-07-12 (×2): 2 g via INTRAVENOUS
  Filled 2014-07-12 (×3): qty 50

## 2014-07-12 MED ORDER — MIDAZOLAM HCL 5 MG/5ML IJ SOLN
INTRAMUSCULAR | Status: DC | PRN
  Administered 2014-07-12 (×2): 1 mg via INTRAVENOUS

## 2014-07-12 MED ORDER — ZOLPIDEM TARTRATE 5 MG PO TABS
5.0000 mg | ORAL_TABLET | Freq: Every evening | ORAL | Status: DC | PRN
  Administered 2014-07-12: 5 mg via ORAL
  Filled 2014-07-12: qty 1

## 2014-07-12 MED ORDER — ARTIFICIAL TEARS OP OINT
TOPICAL_OINTMENT | OPHTHALMIC | Status: AC
  Filled 2014-07-12: qty 3.5

## 2014-07-12 MED ORDER — ONDANSETRON HCL 4 MG/2ML IJ SOLN
INTRAMUSCULAR | Status: AC
  Filled 2014-07-12: qty 2

## 2014-07-12 MED ORDER — ONDANSETRON HCL 4 MG PO TABS: 4.0000 mg | ORAL_TABLET | Freq: Four times a day (QID) | ORAL | Status: DC | PRN

## 2014-07-12 MED ORDER — SUCCINYLCHOLINE CHLORIDE 20 MG/ML IJ SOLN
INTRAMUSCULAR | Status: AC
  Filled 2014-07-12: qty 1

## 2014-07-12 MED ORDER — METOCLOPRAMIDE HCL 10 MG PO TABS: 5.0000 mg | ORAL_TABLET | Freq: Three times a day (TID) | ORAL | Status: DC | PRN

## 2014-07-12 MED ORDER — SCOPOLAMINE 1 MG/3DAYS TD PT72
MEDICATED_PATCH | TRANSDERMAL | Status: AC
  Filled 2014-07-12: qty 1

## 2014-07-12 MED ORDER — HYDROMORPHONE HCL PF 1 MG/ML IJ SOLN
1.0000 mg | INTRAMUSCULAR | Status: DC | PRN
  Administered 2014-07-12 (×2): 1 mg via INTRAVENOUS
  Filled 2014-07-12 (×2): qty 1

## 2014-07-12 MED ORDER — PROPOFOL 10 MG/ML IV BOLUS
INTRAVENOUS | Status: AC
  Filled 2014-07-12: qty 20

## 2014-07-12 MED ORDER — FENTANYL CITRATE 0.05 MG/ML IJ SOLN
25.0000 ug | INTRAMUSCULAR | Status: DC | PRN
  Administered 2014-07-12 (×4): 25 ug via INTRAVENOUS

## 2014-07-12 MED ORDER — LIDOCAINE HCL (CARDIAC) 10 MG/ML IV SOLN
INTRAVENOUS | Status: DC | PRN
  Administered 2014-07-12: 100 mg via INTRAVENOUS

## 2014-07-12 MED ORDER — SENNOSIDES-DOCUSATE SODIUM 8.6-50 MG PO TABS: 1.0000 | ORAL_TABLET | Freq: Every evening | ORAL | Status: DC | PRN

## 2014-07-12 MED ORDER — ONDANSETRON HCL 4 MG/2ML IJ SOLN
INTRAMUSCULAR | Status: DC | PRN
  Administered 2014-07-12: 4 mg via INTRAVENOUS

## 2014-07-12 MED ORDER — ARTIFICIAL TEARS OP OINT
TOPICAL_OINTMENT | OPHTHALMIC | Status: DC | PRN
  Administered 2014-07-12: 1 via OPHTHALMIC

## 2014-07-12 MED ORDER — FENTANYL CITRATE 0.05 MG/ML IJ SOLN
INTRAMUSCULAR | Status: AC
  Administered 2014-07-12: 10:00:00
  Filled 2014-07-12: qty 2

## 2014-07-12 MED ORDER — BUPIVACAINE-EPINEPHRINE 0.5% -1:200000 IJ SOLN
INTRAMUSCULAR | Status: DC | PRN
  Administered 2014-07-12: 20 mL

## 2014-07-12 MED ORDER — EPHEDRINE SULFATE 50 MG/ML IJ SOLN
INTRAMUSCULAR | Status: AC
  Filled 2014-07-12: qty 1

## 2014-07-12 MED ORDER — ONDANSETRON HCL 4 MG/2ML IJ SOLN
4.0000 mg | Freq: Four times a day (QID) | INTRAMUSCULAR | Status: DC | PRN
  Administered 2014-07-12: 4 mg via INTRAVENOUS
  Filled 2014-07-12: qty 2

## 2014-07-12 MED ORDER — PROPOFOL 10 MG/ML IV BOLUS
INTRAVENOUS | Status: DC | PRN
  Administered 2014-07-12: 150 mg via INTRAVENOUS
  Administered 2014-07-12: 50 mg via INTRAVENOUS

## 2014-07-12 MED ORDER — SCOPOLAMINE 1 MG/3DAYS TD PT72
MEDICATED_PATCH | TRANSDERMAL | Status: DC | PRN
  Administered 2014-07-12: 1 via TRANSDERMAL

## 2014-07-12 MED ORDER — HYDROCODONE-ACETAMINOPHEN 10-325 MG PO TABS
1.0000 | ORAL_TABLET | ORAL | Status: DC | PRN
  Administered 2014-07-12 (×2): 2 via ORAL
  Administered 2014-07-12: 1 via ORAL
  Administered 2014-07-13 (×2): 2 via ORAL
  Filled 2014-07-12 (×4): qty 2
  Filled 2014-07-12: qty 1

## 2014-07-12 MED ORDER — ONDANSETRON HCL 4 MG/2ML IJ SOLN: 4.0000 mg | Freq: Once | INTRAMUSCULAR | Status: DC | PRN

## 2014-07-12 MED ORDER — BUPIVACAINE LIPOSOME 1.3 % IJ SUSP
INTRAMUSCULAR | Status: DC | PRN
  Administered 2014-07-12: 20 mL

## 2014-07-12 MED ORDER — FENTANYL CITRATE 0.05 MG/ML IJ SOLN
INTRAMUSCULAR | Status: DC | PRN
  Administered 2014-07-12 (×3): 25 ug via INTRAVENOUS
  Administered 2014-07-12: 50 ug via INTRAVENOUS
  Administered 2014-07-12: 25 ug via INTRAVENOUS
  Administered 2014-07-12 (×2): 50 ug via INTRAVENOUS

## 2014-07-12 MED ORDER — METHOCARBAMOL 500 MG PO TABS
500.0000 mg | ORAL_TABLET | Freq: Four times a day (QID) | ORAL | Status: DC | PRN
  Administered 2014-07-12 – 2014-07-13 (×2): 500 mg via ORAL
  Filled 2014-07-12 (×3): qty 1

## 2014-07-12 MED ORDER — TRAMADOL HCL 50 MG PO TABS
50.0000 mg | ORAL_TABLET | Freq: Four times a day (QID) | ORAL | Status: DC | PRN
  Administered 2014-07-12: 50 mg via ORAL
  Filled 2014-07-12: qty 1

## 2014-07-12 MED ORDER — SIMVASTATIN 10 MG PO TABS
10.0000 mg | ORAL_TABLET | Freq: Every day | ORAL | Status: DC
  Filled 2014-07-12 (×2): qty 1

## 2014-07-12 MED ORDER — IBUPROFEN 600 MG PO TABS
600.0000 mg | ORAL_TABLET | Freq: Four times a day (QID) | ORAL | Status: DC | PRN
  Filled 2014-07-12: qty 1

## 2014-07-12 MED ORDER — 0.9 % SODIUM CHLORIDE (POUR BTL) OPTIME
TOPICAL | Status: DC | PRN
  Administered 2014-07-12: 1000 mL

## 2014-07-12 MED ORDER — STERILE WATER FOR INJECTION IJ SOLN
INTRAMUSCULAR | Status: AC
  Filled 2014-07-12: qty 10

## 2014-07-12 MED ORDER — DOCUSATE SODIUM 100 MG PO CAPS
100.0000 mg | ORAL_CAPSULE | Freq: Two times a day (BID) | ORAL | Status: DC
  Administered 2014-07-12 – 2014-07-13 (×3): 100 mg via ORAL
  Filled 2014-07-12 (×4): qty 1

## 2014-07-12 MED ORDER — LACTATED RINGERS IV SOLN
INTRAVENOUS | Status: DC | PRN
  Administered 2014-07-12 (×2): via INTRAVENOUS

## 2014-07-12 MED ORDER — LIDOCAINE HCL (CARDIAC) 20 MG/ML IV SOLN
INTRAVENOUS | Status: AC
  Filled 2014-07-12: qty 5

## 2014-07-12 MED ORDER — HYDROCORTISONE NA SUCCINATE PF 100 MG IJ SOLR
INTRAMUSCULAR | Status: DC | PRN
  Administered 2014-07-12: 100 mg via INTRAVENOUS

## 2014-07-12 MED ORDER — ACETAMINOPHEN 650 MG RE SUPP: 650.0000 mg | Freq: Four times a day (QID) | RECTAL | Status: DC | PRN

## 2014-07-12 MED ORDER — PHENOL 1.4 % MT LIQD
1.0000 | OROMUCOSAL | Status: DC | PRN
  Administered 2014-07-12: 1 via OROMUCOSAL
  Filled 2014-07-12: qty 177

## 2014-07-12 MED ORDER — BUPIVACAINE-EPINEPHRINE (PF) 0.5% -1:200000 IJ SOLN
INTRAMUSCULAR | Status: AC
  Filled 2014-07-12: qty 30

## 2014-07-12 MED ORDER — DIPHENHYDRAMINE HCL 12.5 MG/5ML PO ELIX: 12.5000 mg | ORAL_SOLUTION | ORAL | Status: DC | PRN

## 2014-07-12 SURGICAL SUPPLY — 58 items
BANDAGE ESMARK 6X9 LF (GAUZE/BANDAGES/DRESSINGS) ×1 IMPLANT
BLADE SAGITTAL 13X1.27X60 (BLADE) ×2 IMPLANT
BLADE SAGITTAL 13X1.27X60MM (BLADE) ×1
BLADE SAW SGTL 83.5X18.5 (BLADE) ×3 IMPLANT
BNDG CMPR 9X6 STRL LF SNTH (GAUZE/BANDAGES/DRESSINGS) ×1
BNDG ESMARK 6X9 LF (GAUZE/BANDAGES/DRESSINGS) ×3
BOWL SMART MIX CTS (DISPOSABLE) ×3 IMPLANT
CAP POR NKTM CP VIT E LN CER ×2 IMPLANT
CEMENT BONE SIMPLEX SPEEDSET (Cement) ×6 IMPLANT
COVER SURGICAL LIGHT HANDLE (MISCELLANEOUS) ×3 IMPLANT
CUFF TOURNIQUET SINGLE 34IN LL (TOURNIQUET CUFF) ×3 IMPLANT
DRAPE EXTREMITY T 121X128X90 (DRAPE) ×3 IMPLANT
DRAPE INCISE IOBAN 66X45 STRL (DRAPES) ×6 IMPLANT
DRAPE PROXIMA HALF (DRAPES) ×3 IMPLANT
DRAPE U-SHAPE 47X51 STRL (DRAPES) ×3 IMPLANT
DRSG ADAPTIC 3X8 NADH LF (GAUZE/BANDAGES/DRESSINGS) ×3 IMPLANT
DRSG PAD ABDOMINAL 8X10 ST (GAUZE/BANDAGES/DRESSINGS) ×3 IMPLANT
DURAPREP 26ML APPLICATOR (WOUND CARE) ×6 IMPLANT
ELECT REM PT RETURN 9FT ADLT (ELECTROSURGICAL) ×3
ELECTRODE REM PT RTRN 9FT ADLT (ELECTROSURGICAL) ×1 IMPLANT
EVACUATOR 1/8 PVC DRAIN (DRAIN) ×3 IMPLANT
GAUZE SPONGE 4X4 12PLY STRL (GAUZE/BANDAGES/DRESSINGS) ×3 IMPLANT
GLOVE BIOGEL M 7.0 STRL (GLOVE) IMPLANT
GLOVE BIOGEL PI IND STRL 7.5 (GLOVE) IMPLANT
GLOVE BIOGEL PI IND STRL 8.5 (GLOVE) ×2 IMPLANT
GLOVE BIOGEL PI INDICATOR 7.5 (GLOVE)
GLOVE BIOGEL PI INDICATOR 8.5 (GLOVE) ×4
GLOVE SURG ORTHO 8.0 STRL STRW (GLOVE) ×6 IMPLANT
GOWN STRL REUS W/ TWL LRG LVL3 (GOWN DISPOSABLE) ×2 IMPLANT
GOWN STRL REUS W/ TWL XL LVL3 (GOWN DISPOSABLE) ×2 IMPLANT
GOWN STRL REUS W/TWL LRG LVL3 (GOWN DISPOSABLE) ×6
GOWN STRL REUS W/TWL XL LVL3 (GOWN DISPOSABLE) ×6
HANDPIECE INTERPULSE COAX TIP (DISPOSABLE) ×3
HOOD PEEL AWAY FACE SHEILD DIS (HOOD) ×12 IMPLANT
KIT BASIN OR (CUSTOM PROCEDURE TRAY) ×3 IMPLANT
KIT ROOM TURNOVER OR (KITS) ×3 IMPLANT
MANIFOLD NEPTUNE II (INSTRUMENTS) ×3 IMPLANT
NEEDLE 22X1 1/2 (OR ONLY) (NEEDLE) ×6 IMPLANT
NS IRRIG 1000ML POUR BTL (IV SOLUTION) ×3 IMPLANT
PACK TOTAL JOINT (CUSTOM PROCEDURE TRAY) ×3 IMPLANT
PAD ABD 8X10 STRL (GAUZE/BANDAGES/DRESSINGS) ×2 IMPLANT
PAD ARMBOARD 7.5X6 YLW CONV (MISCELLANEOUS) ×6 IMPLANT
PADDING CAST COTTON 6X4 STRL (CAST SUPPLIES) ×3 IMPLANT
SET HNDPC FAN SPRY TIP SCT (DISPOSABLE) ×1 IMPLANT
SPONGE GAUZE 4X4 12PLY STER LF (GAUZE/BANDAGES/DRESSINGS) ×2 IMPLANT
STAPLER VISISTAT 35W (STAPLE) ×3 IMPLANT
SUCTION FRAZIER TIP 10 FR DISP (SUCTIONS) ×3 IMPLANT
SUT BONE WAX W31G (SUTURE) ×3 IMPLANT
SUT VIC AB 0 CTB1 27 (SUTURE) ×6 IMPLANT
SUT VIC AB 1 CT1 27 (SUTURE) ×6
SUT VIC AB 1 CT1 27XBRD ANBCTR (SUTURE) ×2 IMPLANT
SUT VIC AB 2-0 CT1 27 (SUTURE) ×6
SUT VIC AB 2-0 CT1 TAPERPNT 27 (SUTURE) ×2 IMPLANT
SYR 20CC LL (SYRINGE) ×6 IMPLANT
TOWEL OR 17X24 6PK STRL BLUE (TOWEL DISPOSABLE) ×3 IMPLANT
TOWEL OR 17X26 10 PK STRL BLUE (TOWEL DISPOSABLE) ×3 IMPLANT
TRAY FOLEY CATH 14FR (SET/KITS/TRAYS/PACK) ×3 IMPLANT
WATER STERILE IRR 1000ML POUR (IV SOLUTION) ×6 IMPLANT

## 2014-07-12 NOTE — Progress Notes (Signed)
Utilization review completed.  

## 2014-07-12 NOTE — Op Note (Signed)
TOTAL KNEE REPLACEMENT OPERATIVE NOTE:  07/12/2014  1:04 PM  PATIENT:  Jacqueline [name]  48 y.o. female  PRE-OPERATIVE DIAGNOSIS:  osteoarthritis right knee  POST-OPERATIVE DIAGNOSIS:  OA right knee  PROCEDURE:  Procedure(s): RIGHT TOTAL KNEE ARTHROPLASTY  SURGEON:  Surgeon(s): Vickey Huger, MD  PHYSICIAN ASSISTANT: Carlynn Spry, [facility]  ANESTHESIA:   general  DRAINS: Hemovac  SPECIMEN: None  COUNTS:  Correct  TOURNIQUET:   Total Tourniquet Time Documented: Thigh (Right) - 64 minutes Total: Thigh (Right) - 64 minutes   DICTATION:  Indication for procedure:    The patient is a 48 y.o. female who has failed conservative treatment for osteoarthritis right knee.  Informed consent was obtained prior to anesthesia. The risks versus benefits of the operation were explain and in a way the patient can, and did, understand.   On the implant demand matching protocol, this patient scored 15.  Therefore, this patient was receive a polyethylene insert with vitamin E which is a high demand implant.  Description of procedure:     The patient was taken to the operating room and placed under anesthesia.  The patient was positioned in the usual fashion taking care that all body parts were adequately padded and/or protected.  I foley catheter was not placed.  A tourniquet was applied and the leg prepped and draped in the usual sterile fashion.  The extremity was exsanguinated with the esmarch and tourniquet inflated to 350 mmHg.  Pre-operative range of motion was 0-95 degrees flexion.  The knee was in 5 degree of mild varus.  A midline incision approximately 6-7 inches long was made with a #10 blade.  A new blade was used to make a parapatellar arthrotomy going 2-3 cm into the quadriceps tendon, over the patella, and alongside the medial aspect of the patellar tendon.  A synovectomy was then performed with the #10 blade and forceps. I then elevated the deep MCL off the medial tibial metaphysis  subperiosteally around to the semimembranosus attachment.    I everted the patella and used calipers to measure patellar thickness.  I used the reamer to ream down to appropriate thickness to recreate the native thickness.  I then removed excess bone with the rongeur and sagittal saw.  I used the appropriately sized template and drilled the three lug holes.  I then put the trial in place and measured the thickness with the calipers to ensure recreation of the native thickness.  The trial was then removed and the patella subluxed and the knee brought into flexion.  A homan retractor was place to retract and protect the patella and lateral structures.  A Z-retractor was place medially to protect the medial structures.  The extra-medullary alignment system was used to make cut the tibial articular surface perpendicular to the anamotic axis of the tibia and in 3 degrees of posterior slope.  The cut surface and alignment jig was removed.  I then used the intramedullary alignment guide to make a 6 valgus cut on the distal femur.  I then marked out the epicondylar axis on the distal femur.  The posterior condylar axis measured 3 degrees.  I then used the anterior referencing sizer and measured the femur to be a size 6.  The 4-In-1 cutting block was screwed into place in external rotation matching the posterior condylar angle, making our cuts perpendicular to the epicondylar axis.  Anterior, posterior and chamfer cuts were made with the sagittal saw.  The cutting block and cut pieces were removed.  A lamina spreader was placed in 90 degrees of flexion.  The ACL, PCL, menisci, and posterior condylar osteophytes were removed.  A 10 mm spacer blocked was found to offer good flexion and extension gap balance after minimal in degree releasing.   The scoop retractor was then placed and the femoral finishing block was pinned in place.  The small sagittal saw was used as well as the lug drill to finish the femur.  The block  and cut surfaces were removed and the medullary canal hole filled with autograft bone from the cut pieces.  The tibia was delivered forward in deep flexion and external rotation.  A size C tray was selected and pinned into place centered on the medial 1/3 of the tibial tubercle.  The reamer and keel was used to prepare the tibia through the tray.    I then trialed with the size 6 femur, size C tibia, a 10 mm insert and the 30 patella.  I had excellent flexion/extension gap balance, excellent patella tracking.  Flexion was full and beyond 120 degrees; extension was zero.  These components were chosen and the staff opened them to me on the back table while the knee was lavaged copiously and the cement mixed.  The soft tissue was infiltrated with 60cc of exparel 1.3% through a 21 gauge needle.  I cemented in the components and removed all excess cement.  The polyethylene tibial component was snapped into place and the knee placed in extension while cement was hardening.  The capsule was infilltrated with 30cc of .25% Marcaine with epinephrine.  A hemovac was place in the joint exiting superolaterally.  A pain pump was place superomedially superficial to the arthrotomy.  Once the cement was hard, the tourniquet was let down.  Hemostasis was obtained.  The arthrotomy was closed with figure-8 #1 vicryl sutures.  The deep soft tissues were closed with #0 vicryls and the subcuticular layer closed with a running #2-0 vicryl.  The skin was reapproximated and closed with skin staples.  The wound was dressed with xeroform, 4 x4's, 2 ABD sponges, a single layer of webril and a TED stocking.   The patient was then awakened, extubated, and taken to the recovery room in stable condition.  BLOOD LOSS:  300cc DRAINS: 1 hemovac, 1 pain catheter COMPLICATIONS:  None.  PLAN OF CARE: Admit to inpatient   PATIENT DISPOSITION:  PACU - hemodynamically stable.   Delay start of Pharmacological VTE agent (>24hrs) due to  surgical blood loss or risk of bleeding:  not applicable  Please fax a copy of this op note to my office at [phone] (please only include page 1 and 2 of the Case Information op note)

## 2014-07-12 NOTE — Anesthesia Postprocedure Evaluation (Signed)
  Anesthesia Post-op Note  Patient: Jacqueline [name]  Procedure(s) Performed: Procedure(s): RIGHT TOTAL KNEE ARTHROPLASTY (Right)  Patient Location: PACU  Anesthesia Type:GA combined with regional for post-op pain  Level of Consciousness: awake, alert  and oriented  Airway and Oxygen Therapy: Patient Spontanous Breathing and Patient connected to nasal cannula oxygen  Post-op Pain: mild  Post-op Assessment: Post-op Vital signs reviewed, Patient's Cardiovascular Status Stable, Respiratory Function Stable, Patent Airway, No signs of Nausea or vomiting and Pain level controlled  Post-op Vital Signs: stable  Last Vitals:  Filed Vitals:   07/12/14 1045  BP:   Pulse: 81  Temp:   Resp: 23    Complications: No apparent anesthesia complications

## 2014-07-12 NOTE — Anesthesia Preprocedure Evaluation (Addendum)
Anesthesia Evaluation  Patient identified by MRN, date of birth, ID band Patient awake    Reviewed: Allergy & Precautions, H&P , NPO status , Patient's Chart, lab work & pertinent test results  History of Anesthesia Complications (+) PONV  Airway Mallampati: III TM Distance: >3 FB   Mouth opening: Limited Mouth Opening  Dental  (+) Teeth Intact, Dental Advisory Given   Pulmonary  breath sounds clear to auscultation        Cardiovascular Rhythm:Regular Rate:Normal     Neuro/Psych    GI/Hepatic   Endo/Other  Hyperthyroidism   Renal/GU      Musculoskeletal  (+) Arthritis -, Rheumatoid disorders,    Abdominal (+) + obese,   Peds  Hematology   Anesthesia Other Findings   Reproductive/Obstetrics                         Anesthesia Physical Anesthesia Plan  ASA: II  Anesthesia Plan: General   Post-op Pain Management:    Induction: Intravenous  Airway Management Planned: LMA  Additional Equipment:   Intra-op Plan:   Post-operative Plan: Extubation in OR  Informed Consent: I have reviewed the patients History and Physical, chart, labs and discussed the procedure including the risks, benefits and alternatives for the proposed anesthesia with the patient or authorized representative who has indicated his/her understanding and acceptance.   Dental advisory given  Plan Discussed with: CRNA and Anesthesiologist  Anesthesia Plan Comments: (Rheumatoid arthritis on long term prednisone H/O PONV  Plan GA with adductor canal block/ LMA  Roberts Gaudy, MD   )        Anesthesia Quick Evaluation

## 2014-07-12 NOTE — Progress Notes (Signed)
Orthopedic Tech Progress Note Patient Details:  Jacqueline [name] [birth year] [id number] On cpm at 8:25 pm RLE  Patient ID: Jacqueline [name], female   DOB: 05-11-1966, 48 y.o.   MRN: [id number]   Braulio Bosch 07/12/2014, 8:24 PM

## 2014-07-12 NOTE — Progress Notes (Signed)
Orthopedic Tech Progress Note Patient Details:  Jacqueline [name] [birth year] [id number]  CPM Right Knee CPM Right Knee: On Right Knee Flexion (Degrees): 90 Right Knee Extension (Degrees): 0 Additional Comments: Trapeze bar and foot roll   Cammer, Theodoro Parma 07/12/2014, 1:58 PM

## 2014-07-12 NOTE — H&P (Signed)
Jacqueline [name] MRN:  [id number] DOB/SEX:  1965-11-25/female  CHIEF COMPLAINT:  Painful right Knee  HISTORY: Patient is a 48 y.o. female presented with a history of pain in the right knee. Onset of symptoms was gradual starting several years ago with gradually worsening course since that time. Prior procedures on the knee include none. Patient has been treated conservatively with over-the-counter NSAIDs and activity modification. Patient currently rates pain in the knee at 10 out of 10 with activity. There is pain at night.  PAST MEDICAL HISTORY: Patient Active Problem List   Diagnosis Date Noted  . S/P total knee arthroplasty 03/29/2014  . HYPERTHYROIDISM 05/29/2007  . RHEUMATOID ARTHRITIS 05/29/2007   Past Medical History  Diagnosis Date  . PONV (postoperative nausea and vomiting)     usually needs patch  . Arthritis     rheumatoid  . Cancer     melanoma rem rt shoulder   Past Surgical History  Procedure Laterality Date  . Melonama Right     shoulder  . Foot surgery Left 05,08    screws,pins, rt foot 06  . Abdominal hysterectomy      partial  . Tubal ligation    . Knee arthroscopy Right     cartilage  . Total knee arthroplasty Left 03/29/2014    Procedure: LEFT TOTAL KNEE ARTHROPLASTY;  Surgeon: Vickey Huger, MD;  Location: [facility];  Service: Orthopedics;  Laterality: Left;     MEDICATIONS:   Prescriptions prior to admission  Medication Sig Dispense Refill  . Calcium Carb-Cholecalciferol (CALCIUM 600 + D PO) Take 1 tablet by mouth daily.      . folic acid (FOLVITE) 102 MCG tablet Take 800 mcg by mouth daily.      Marland Kitchen HYDROcodone-acetaminophen (NORCO) 10-325 MG per tablet Take 0.5-1 tablets by mouth every 6 (six) hours as needed for moderate pain or severe pain.      Marland Kitchen ibuprofen (ADVIL,MOTRIN) 200 MG tablet Take 600 mg by mouth every 6 (six) hours as needed for fever, headache or mild pain.       . methotrexate (RHEUMATREX) 2.5 MG tablet Take 7.5 mg by mouth 2 (two) times a  week. Friday & Saturday - Caution:Chemotherapy. Protect from light.      . pravastatin (PRAVACHOL) 20 MG tablet Take 20 mg by mouth daily.      . predniSONE (DELTASONE) 5 MG tablet Take 5 mg by mouth daily with breakfast.      . traMADol (ULTRAM) 50 MG tablet Take by mouth every 6 (six) hours as needed.      . vitamin C (ASCORBIC ACID) 500 MG tablet Take 500 mg by mouth daily.        ALLERGIES:   Allergies  Allergen Reactions  . Codeine Nausea And Vomiting  . Oxycodone-Acetaminophen Itching    REVIEW OF SYSTEMS:  A comprehensive review of systems was negative.   FAMILY HISTORY:  History reviewed. No pertinent family history.  SOCIAL HISTORY:   History  Substance Use Topics  . Smoking status: Never Smoker   . Smokeless tobacco: Not on file  . Alcohol Use: No     EXAMINATION:  Vital signs in last 24 hours: Temp:  [99.1 F (37.3 C)] 99.1 F (37.3 C) (09/14 0611) Pulse Rate:  [69] 69 (09/14 0611) Resp:  [20] 20 (09/14 0611) SpO2:  [100 %] 100 % (09/14 0611) Weight:  [84.369 kg (186 lb)] 84.369 kg (186 lb) (09/14 7253)  General appearance: alert, cooperative and no distress Lungs:  clear to auscultation bilaterally Heart: regular rate and rhythm, S1, S2 normal, no murmur, click, rub or gallop Abdomen: soft, non-tender; bowel sounds normal; no masses,  no organomegaly Extremities: extremities normal, atraumatic, no cyanosis or edema and Homans sign is negative, no sign of DVT Pulses: 2+ and symmetric Skin: Skin color, texture, turgor normal. No rashes or lesions Neurologic: Alert and oriented X 3, normal strength and tone. Normal symmetric reflexes. Normal coordination and gait  Musculoskeletal:  ROM 0-110, Ligaments intact,  Imaging Review Plain radiographs demonstrate severe degenerative joint disease of the right knee. The overall alignment is significant varus. The bone quality appears to be good for age and reported activity level.  Assessment/Plan: End stage  arthritis, right knee   The patient history, physical examination and imaging studies are consistent with advanced degenerative joint disease of the right knee. The patient has failed conservative treatment.  The clearance notes were reviewed.  After discussion with the patient it was felt that Total Knee Replacement was indicated. The procedure,  risks, and benefits of total knee arthroplasty were presented and reviewed. The risks including but not limited to aseptic loosening, infection, blood clots, vascular injury, stiffness, patella tracking problems complications among others were discussed. The patient acknowledged the explanation, agreed to proceed with the plan.  Carlisle Enke 07/12/2014, 6:52 AM

## 2014-07-12 NOTE — Evaluation (Signed)
Physical Therapy Evaluation Patient Details Name: Jacqueline [name] MRN: [id number] DOB: Oct 14, 1966 Today's Date: 07/12/2014   History of Present Illness  47 y.o. female s/p right total knee arthroplasty. Hx of rheumatoid arthritis, hyperthyroidism, and s/p left TKA (03/2014)  Clinical Impression  Pt is s/p right TKA resulting in the deficits listed below (see PT Problem List). Tolerated ambulating in room up to 30 feet post op day #0. Limited exercises secondary to nausea at end of therapy session (no emesis). Anticipate she will progress quickly and be adequate for d/c home with husband who plans provide 24 hour care. Pt will benefit from skilled PT to increase their independence and safety with mobility to allow discharge to the venue listed below.      Follow Up Recommendations Home health PT;Supervision for mobility/OOB    Equipment Recommendations  None recommended by PT    Recommendations for Other Services OT consult     Precautions / Restrictions Precautions Precautions: Knee Precaution Comments: Reviewed knee precautions Restrictions Weight Bearing Restrictions: Yes RLE Weight Bearing: Weight bearing as tolerated      Mobility  Bed Mobility Overal bed mobility: Needs Assistance Bed Mobility: Supine to Sit     Supine to sit: Supervision;HOB elevated     General bed mobility comments: Supervision for safety. VC for technique. HOB minimally elevated. Use of bed rail  Transfers Overall transfer level: Needs assistance Equipment used: Rolling walker (2 wheeled) Transfers: Sit to/from Stand Sit to Stand: Min guard         General transfer comment: Min guard for safety from lowest bed setting. VC for hand placement. Good stability with RW upon standing, encouraged to increase WB through RLE.  Ambulation/Gait Ambulation/Gait assistance: Min guard Ambulation Distance (Feet): 30 Feet Assistive device: Rolling walker (2 wheeled) Gait Pattern/deviations: Step-to  pattern;Decreased step length - left;Decreased stance time - right;Antalgic   Gait velocity interpretation: Below normal speed for age/gender General Gait Details: Slow and guarded gait. Educated on safe DME use with rolling walker. VC for walker placement and sequencing. No instances of buckling at right knee.  Stairs            Wheelchair Mobility    Modified Rankin (Stroke Patients Only)       Balance Overall balance assessment: Needs assistance Sitting-balance support: No upper extremity supported;Feet supported Sitting balance-Leahy Scale: Good     Standing balance support: No upper extremity supported Standing balance-Leahy Scale: Fair                               Pertinent Vitals/Pain Pain Assessment: 0-10 Pain Score: 7  Pain Location: Right knee Pain Intervention(s): Limited activity within patient's tolerance;Monitored during session;Repositioned;Premedicated before session    Home Living Family/patient expects to be discharged to:: Private residence Living Arrangements: Spouse/significant other Available Help at Discharge: Family;Available 24 hours/day Type of Home: House Home Access: Stairs to enter Entrance Stairs-Rails: Right Entrance Stairs-Number of Steps: 4 Home Layout: One level Home Equipment: Walker - 2 wheels;Crutches;Bedside commode;Shower seat (3-in-1)      Prior Function Level of Independence: Independent with assistive device(s)         Comments: crutches only this past week for ambulation due to stopping pain medications prior to surgery.     Hand Dominance   Dominant Hand: Right    Extremity/Trunk Assessment   Upper Extremity Assessment: Defer to OT evaluation  Lower Extremity Assessment: RLE deficits/detail RLE Deficits / Details: decreased strength and ROM as expected post op       Communication   Communication: No difficulties  Cognition Arousal/Alertness: Awake/alert Behavior During  Therapy: WFL for tasks assessed/performed Overall Cognitive Status: Within Functional Limits for tasks assessed                      General Comments      Exercises Total Joint Exercises Ankle Circles/Pumps: AROM;Both;15 reps;Seated Quad Sets: AROM;Right;5 reps;Seated Long Arc Quad: AAROM;Right;5 reps;Seated Knee Flexion: AROM;Right;5 reps;Seated Goniometric ROM: untested due to nausea at end of therapy      Assessment/Plan    PT Assessment Patient needs continued PT services  PT Diagnosis Difficulty walking;Abnormality of gait;Acute pain   PT Problem List Decreased strength;Decreased range of motion;Decreased activity tolerance;Decreased balance;Decreased mobility;Decreased knowledge of use of DME;Decreased knowledge of precautions;Pain  PT Treatment Interventions DME instruction;Gait training;Stair training;Functional mobility training;Therapeutic activities;Therapeutic exercise;Balance training;Neuromuscular re-education;Patient/family education;Modalities   PT Goals (Current goals can be found in the Care Plan section) Acute Rehab PT Goals Patient Stated Goal: Be able to drive again PT Goal Formulation: With patient Time For Goal Achievement: 07/19/14 Potential to Achieve Goals: Good    Frequency 7X/week   Barriers to discharge        Co-evaluation               End of Session   Activity Tolerance: Other (comment) (Pt became nauseous towards end of therapy session) Patient left: in chair;with call bell/phone within reach;with family/visitor present Nurse Communication: Mobility status         Time: 6948-5462 PT Time Calculation (min): 24 min   Charges:   PT Evaluation $Initial PT Evaluation Tier I: 1 Procedure PT Treatments $Therapeutic Activity: 8-22 mins   PT G Codes:        Elayne Snare, Sharon   [name] 07/12/2014, 2:59 PM

## 2014-07-12 NOTE — Anesthesia Procedure Notes (Addendum)
Anesthesia Regional Block:  Adductor canal block  Pre-Anesthetic Checklist: ,, timeout performed, Correct Patient, Correct Site, Correct Laterality, Correct Procedure, Correct Position, site marked, Risks and benefits discussed,  Surgical consent,  Pre-op evaluation,  At surgeon's request and post-op pain management  Laterality: Right  Prep: chloraprep       Needles:  Injection technique: Single-shot     Needle Length: 9cm 9 cm Needle Gauge: 22 and 22 G    Additional Needles:  Procedures: ultrasound guided (picture in chart) Adductor canal block Narrative:  Start time: 07/12/2014 7:15 AM End time: 07/12/2014 7:20 AM Injection made incrementally with aspirations every 5 mL.  Performed by: Personally   Additional Notes: 30 cc 0.5% Marcaine 1:200 Epi injected easily   Procedure Name: LMA Insertion Date/Time: 07/12/2014 7:42 AM Performed by: Storm Frisk E Pre-anesthesia Checklist: Patient identified, Timeout performed, Emergency Drugs available, Suction available and Patient being monitored Patient Re-evaluated:Patient Re-evaluated prior to inductionOxygen Delivery Method: Circle system utilized Preoxygenation: Pre-oxygenation with 100% oxygen Intubation Type: IV induction LMA: LMA inserted LMA Size: 4.0 Tube secured with: Tape Dental Injury: Teeth and Oropharynx as per pre-operative assessment

## 2014-07-12 NOTE — Transfer of Care (Signed)
Immediate Anesthesia Transfer of Care Note  Patient: Jacqueline [name]  Procedure(s) Performed: Procedure(s): RIGHT TOTAL KNEE ARTHROPLASTY (Right)  Patient Location: PACU  Anesthesia Type:General  Level of Consciousness: sedated  Airway & Oxygen Therapy: Patient Spontanous Breathing and Patient connected to face mask oxygen  Post-op Assessment: Report given to PACU RN and Post -op Vital signs reviewed and stable  Post vital signs: Reviewed and stable  Complications: No apparent anesthesia complications

## 2014-07-13 MED ORDER — ASPIRIN EC 325 MG PO TBEC: 325.0000 mg | DELAYED_RELEASE_TABLET | Freq: Every day | ORAL | Status: DC

## 2014-07-13 MED ORDER — ZALEPLON 5 MG PO CAPS: 5.0000 mg | ORAL_CAPSULE | Freq: Every evening | ORAL | Status: DC | PRN

## 2014-07-13 MED ORDER — HYDROCODONE-ACETAMINOPHEN 10-325 MG PO TABS: 1.0000 | ORAL_TABLET | ORAL | Status: DC | PRN

## 2014-07-13 MED ORDER — METHOCARBAMOL 500 MG PO TABS: 500.0000 mg | ORAL_TABLET | Freq: Four times a day (QID) | ORAL | Status: DC | PRN

## 2014-07-13 NOTE — Evaluation (Signed)
Occupational Therapy Evaluation Patient Details Name: Jacqueline [name] MRN: [id number] DOB: 11/09/1965 Today's Date: 07/13/2014    History of Present Illness 48 y.o. female s/p right total knee arthroplasty. Hx of rheumatoid arthritis, hyperthyroidism, and s/p left TKA (03/2014)   Clinical Impression   Pt admitted with the above diagnoses and presents with below problem list. Pt will benefit from continued acute OT to address the below listed deficits and maximize independence with basic ADLs prior to d/c home with family. PTA pt was mod I with ADLs using crutches shortly before surgery. Pt currently at min guard level for LB ADLs and functional transfers.       Follow Up Recommendations  Supervision/Assistance - 24 hour;No OT follow up    Equipment Recommendations  None recommended by OT;Other (comment) (pt has recommended equipment)    Recommendations for Other Services       Precautions / Restrictions Precautions Precautions: Knee Precaution Comments: Reviewed knee precautions Restrictions Weight Bearing Restrictions: Yes RLE Weight Bearing: Weight bearing as tolerated      Mobility Bed Mobility Overal bed mobility: Modified Independent             General bed mobility comments: pt in recliner  Transfers Overall transfer level: Needs assistance Equipment used: Rolling walker (2 wheeled) Transfers: Sit to/from Stand Sit to Stand: Min guard         General transfer comment: good technique    Balance Overall balance assessment: Needs assistance Sitting-balance support: No upper extremity supported;Feet supported Sitting balance-Leahy Scale: Good     Standing balance support: No upper extremity supported;During functional activity Standing balance-Leahy Scale: Fair Standing balance comment: able to maintain balance while manipulating clothing in standing                             ADL Overall ADL's : Needs  assistance/impaired Eating/Feeding: Set up;Sitting   Grooming: Set up;Sitting;Standing   Upper Body Bathing: Set up;Sitting   Lower Body Bathing: Min guard;With adaptive equipment;Sit to/from stand   Upper Body Dressing : Set up;Sitting   Lower Body Dressing: Min guard;Sit to/from stand   Toilet Transfer: Min guard;Ambulation;BSC;RW   Toileting- Water quality scientist and Hygiene: Min guard;Sit to/from stand   Tub/ Shower Transfer: Min guard;Ambulation;3 in 1;Rolling walker   Functional mobility during ADLs: Min guard;Rolling walker General ADL Comments: Reviewed techniques and AE for safe completion of ADLs with knee precautions. Pt familiar with techniques.      Vision                     Perception     Praxis      Pertinent Vitals/Pain Pain Assessment: 0-10 Pain Score: 7  Pain Location: R knee Pain Descriptors / Indicators: Aching Pain Intervention(s): Limited activity within patient's tolerance;Monitored during session;RN gave pain meds during session     Hand Dominance Right   Extremity/Trunk Assessment Upper Extremity Assessment Upper Extremity Assessment: Overall WFL for tasks assessed   Lower Extremity Assessment Lower Extremity Assessment: Defer to PT evaluation       Communication Communication Communication: No difficulties   Cognition Arousal/Alertness: Awake/alert Behavior During Therapy: WFL for tasks assessed/performed Overall Cognitive Status: Within Functional Limits for tasks assessed                     General Comments       Exercises Exercises: Total Joint     Shoulder Instructions  Home Living Family/patient expects to be discharged to:: Private residence Living Arrangements: Spouse/significant other Available Help at Discharge: Family;Available 24 hours/day Type of Home: House Home Access: Stairs to enter [employer] of Steps: 4 Entrance Stairs-Rails: Right Home Layout: One level      Bathroom Shower/Tub: Teacher, early years/pre: Standard Bathroom Accessibility: Yes How Accessible: Accessible via wheelchair;Other (comment) (side steps in bathroom) Home Equipment: Walker - 2 wheels;Crutches;Bedside commode;Shower seat          Prior Functioning/Environment Level of Independence: Independent with assistive device(s)        Comments: crutches prior to surgery    OT Diagnosis: Acute pain   OT Problem List: Impaired balance (sitting and/or standing);Decreased knowledge of use of DME or AE;Decreased knowledge of precautions;Pain   OT Treatment/Interventions: Self-care/ADL training;Therapeutic exercise;DME and/or AE instruction;Therapeutic activities;Patient/family education;Balance training    OT Goals(Current goals can be found in the care plan section) Acute Rehab OT Goals Patient Stated Goal: not use scooter at [facility] again OT Goal Formulation: With patient Time For Goal Achievement: 07/20/14 Potential to Achieve Goals: Good ADL Goals Pt Will Perform Lower Body Bathing: with modified independence;with adaptive equipment;sit to/from stand Pt Will Perform Lower Body Dressing: with modified independence;with adaptive equipment;sit to/from stand Pt Will Transfer to Toilet: with modified independence;ambulating (3n1 over toilet) Pt Will Perform Toileting - Clothing Manipulation and hygiene: with modified independence;with adaptive equipment;sit to/from stand Pt Will Perform Tub/Shower Transfer: with modified independence;ambulating;3 in 1;rolling walker  OT Frequency: Min 2X/week   Barriers to D/C:            Co-evaluation              End of Session Equipment Utilized During Treatment: Gait belt;Rolling walker CPM Right Knee CPM Right Knee: Off Additional Comments: educated on foot roll; pt stated she had been in it for 15 minutes this morning, declined foot roll due to 7/10 pain  Activity Tolerance: Patient tolerated treatment  well Patient left: in chair;with call bell/phone within reach   Time: 0925-0945 OT Time Calculation (min): 20 min Charges:  OT General Charges $OT Visit: 1 Procedure OT Evaluation $Initial OT Evaluation Tier I: 1 Procedure OT Treatments $Self Care/Home Management : 8-22 mins G-Codes:    Hortencia Pilar Jul 26, 2014, 10:10 AM

## 2014-07-13 NOTE — Progress Notes (Signed)
SPORTS MEDICINE AND JOINT REPLACEMENT  Lara Mulch, MD   Carlynn Spry, PA-C [place], [place], [place]  [address]                             [phone]   PROGRESS NOTE  Subjective:  negative for Chest Pain  negative for Shortness of Breath  negative for Nausea/Vomiting   negative for Calf Pain  negative for Bowel Movement   Tolerating Diet: yes         Patient reports pain as 3 on 0-10 scale.    Objective: Vital signs in last 24 hours:   Patient Vitals for the past 24 hrs:  BP Temp Temp src Pulse Resp SpO2  07/13/14 0700 117/53 mmHg 98 F (36.7 C) Oral 75 17 99 %  07/13/14 0425 127/65 mmHg 98.4 F (36.9 C) Oral 65 16 99 %  07/13/14 0400 - - - - 16 94 %  07/13/14 0048 128/53 mmHg 98.8 F (37.1 C) - 80 16 93 %  07/13/14 0000 - - - - 16 100 %  07/12/14 2100 135/60 mmHg 99.4 F (37.4 C) Oral 73 16 99 %  07/12/14 2000 - - - - 16 99 %  07/12/14 1317 119/61 mmHg 98.5 F (36.9 C) Oral 63 18 98 %    @flow {1959:LAST@   Intake/Output from previous day:   09/14 0701 - 09/15 0700 In: 1750 [I.V.:1650] Out: 610 [Urine:200; Drains:310]   Intake/Output this shift:   09/15 0701 - 09/15 1900 In: 240 [P.O.:240] Out: -    Intake/Output     09/14 0701 - 09/15 0700 09/15 0701 - 09/16 0700   P.O.  240   I.V. (mL/kg) 1650 (19.6)    IV Piggyback 100    Total Intake(mL/kg) 1750 (20.7) 240 (2.8)   Urine (mL/kg/hr) 200 (0.1)    Drains 310 (0.2)    Blood 100 (0)    Total Output 610     Net +1140 +240        Urine Occurrence 2 x       LABORATORY DATA:  Recent Labs  07/12/14 1327  WBC 9.4  HGB 10.4*  HCT 32.0*  PLT 265    Recent Labs  07/12/14 1327  CREATININE 0.60   Lab Results  Component Value Date   INR 1.00 07/02/2014   INR 0.93 03/19/2014    Examination:  General appearance: alert, cooperative and no distress Extremities: Homans sign is negative, no sign of DVT  Wound Exam: clean, dry, intact   Drainage:  None: wound tissue dry  Motor  Exam: EHL and FHL Intact  Sensory Exam: Deep Peroneal normal   Assessment:    1 Day Post-Op  Procedure(s) (LRB): RIGHT TOTAL KNEE ARTHROPLASTY (Right)  ADDITIONAL DIAGNOSIS:  Active Problems:   S/P total knee arthroplasty  Acute Blood Loss Anemia   Plan: Physical Therapy as ordered Weight Bearing as Tolerated (WBAT)  DVT Prophylaxis: asa  DISCHARGE PLAN: Home  DISCHARGE NEEDS: HHPT, CPM, Walker and 3-in-1 comode seat         Jacqueline [name] 07/13/2014, 12:41 PM

## 2014-07-13 NOTE — Progress Notes (Signed)
CARE MANAGEMENT NOTE 07/13/2014  Patient:  Jacqueline [name], Jacqueline [name]   Account Number:  [id number]  Date Initiated:  07/13/2014  Documentation initiated by:  [facility]  Subjective/Objective Assessment:   admitted s/p rt TKA     Action/Plan:   PT/OT evals-recommended HHPT   Anticipated DC Date:  07/13/2014   Anticipated DC Plan:  [place]  CM consult      [facility] Choice  HOME HEALTH   Choice offered to / List presented to:  C-1 Patient   DME arranged  CPM      DME agency  [employer]     [place] arranged  HH-2 PT      [place]   Status of service:  Completed, signed off Medicare Important Message given?  NA - LOS <3 / Initial given by admissions (If response is "NO", the following Medicare IM given date fields will be blank) Date Medicare IM given:   Medicare IM given by:   Date Additional Medicare IM given:   Additional Medicare IM given by:    Discharge Disposition:  [place]  Per UR Regulation:    If discussed at Long Length of Stay Meetings, dates discussed:    Comments:  07/13/14 Set up with [facility] for HHPT and home CPM with [employer] by MD office. Patient had rolling walker and 3N1 from previous surgery. Fuller Plan RN, BSN, CCM

## 2014-07-13 NOTE — Discharge Instructions (Signed)
Diet: As you were doing prior to hospitalization   Activity:  Increase activity slowly as tolerated                  No lifting or driving for 6 weeks  Shower:  May shower without a dressing once there is no drainage from your wound.                 Do NOT wash over the wound.                 Dressing:  You may change your dressing on Wednesday                    Then change the dressing daily with sterile 4"x4"s gauze dressing                     And TED hose for knees.  Weight Bearing:  Weight bearing as tolerated as taught in physical therapy.  Use a                                walker or Crutches as instructed.  To prevent constipation: you may use a stool softener such as -               Colace ( over the counter) 100 mg by mouth twice a day                Drink plenty of fluids ( prune juice may be helpful) and high fiber foods                Miralax ( over the counter) for constipation as needed.    Precautions:  If you experience chest pain or shortness of breath - call 911 immediately               For transfer to the hospital emergency department!!               If you develop a fever greater that 101 F, purulent drainage from wound,                             increased redness or drainage from wound, or calf pain -- Call the office.  Follow- Up Appointment:  Please call for an appointment to be seen on 07/27/14                                              [facility] office:  [phone]            [address] [place], [place] [address]

## 2014-07-13 NOTE — Progress Notes (Signed)
Physical Therapy Treatment Patient Details Name: Jacqueline [name] MRN: [id number] DOB: Jun 03, 1966 Today's Date: 07/13/2014    History of Present Illness 48 y.o. female s/p right total knee arthroplasty. Hx of rheumatoid arthritis, hyperthyroidism, and s/p left TKA (03/2014)    PT Comments    Patient is progressing well towards physical therapy goals, ambulating up to 200 feet with supervision. Pt does not wish to use rolling walker and prefers crutches; demonstrates fairly good control with this device and was able to safely complete stair training this AM. All education has been reviewed and pt is tolerating therapeutic exercises well. Feel she is adequate for d/c from a mobility standpoint. Patient will continue to benefit from skilled physical therapy services at home with HHPT to further improve independence with functional mobility.    Follow Up Recommendations  Home health PT;Supervision for mobility/OOB     Equipment Recommendations  None recommended by PT    Recommendations for Other Services OT consult     Precautions / Restrictions Precautions Precautions: Knee Precaution Comments: Reviewed knee precautions Restrictions Weight Bearing Restrictions: Yes RLE Weight Bearing: Weight bearing as tolerated    Mobility  Bed Mobility Overal bed mobility: Modified Independent             General bed mobility comments: pt in recliner  Transfers Overall transfer level: Needs assistance Equipment used: Rolling walker (2 wheeled);Crutches Transfers: Sit to/from Stand Sit to Stand: Supervision         General transfer comment: Supervision for safety performed from bed with RW and crutches from recliner. Educated on crutch placement for safe sit<>stand transition. VC for upright posture once standing  Ambulation/Gait Ambulation/Gait assistance: Supervision Ambulation Distance (Feet): 200 Feet (200 with crutches 10 feet with RW) Assistive device: Rolling walker (2  wheeled);Crutches Gait Pattern/deviations: Step-to pattern;Step-through pattern;Decreased step length - left;Decreased stance time - right;Antalgic;Narrow base of support;Trunk flexed   Gait velocity interpretation: Below normal speed for age/gender General Gait Details: Pt does not wish to use rolling walker; educated on crutch use. Moderate VC for upright posture. Demonstrates good control of assistive devices. VC for right knee extension in stance phase for quad activation.   Stairs Stairs: Yes Stairs assistance: Min guard Stair Management: One rail Right;Step to pattern;Forwards;With crutches Number of Stairs: 2 (x2) General stair comments: Educated on stair navigation with crutches per pt request over rolling walker. VC for sequencing and correct use of rail. Pt reports she feels confident with this task.  Wheelchair Mobility    Modified Rankin (Stroke Patients Only)       Balance Overall balance assessment: Needs assistance Sitting-balance support: No upper extremity supported;Feet supported Sitting balance-Leahy Scale: Good     Standing balance support: No upper extremity supported;During functional activity Standing balance-Leahy Scale: Fair Standing balance comment: able to maintain balance while manipulating clothing in standing                     Cognition Arousal/Alertness: Awake/alert Behavior During Therapy: WFL for tasks assessed/performed Overall Cognitive Status: Within Functional Limits for tasks assessed                      Exercises Total Joint Exercises Ankle Circles/Pumps: AROM;Both;Seated;10 reps Quad Sets: AROM;Right;Seated;10 reps Long Arc Quad: AAROM;Right;Seated;10 reps Knee Flexion: AROM;Right;Seated;10 reps Goniometric ROM: 12-79 degrees right knee flexion    General Comments        Pertinent Vitals/Pain Pain Assessment: 0-10 Pain Score:  ("more sore today" no value  given) Pain Location: Rt knee Pain Descriptors /  Indicators: Aching Pain Intervention(s): Limited activity within patient's tolerance;Monitored during session;Repositioned;Premedicated before session    Home Living Family/patient expects to be discharged to:: Private residence Living Arrangements: Spouse/significant other Available Help at Discharge: Family;Available 24 hours/day Type of Home: House Home Access: Stairs to enter Entrance Stairs-Rails: Right Home Layout: One level Home Equipment: Walker - 2 wheels;Crutches;Bedside commode;Shower seat      Prior Function Level of Independence: Independent with assistive device(s)      Comments: crutches prior to surgery   PT Goals (current goals can now be found in the care plan section) Acute Rehab PT Goals Patient Stated Goal: not use scooter at [facility] again PT Goal Formulation: With patient Time For Goal Achievement: 07/19/14 Potential to Achieve Goals: Good Progress towards PT goals: Progressing toward goals    Frequency  7X/week    PT Plan Current plan remains appropriate    Co-evaluation             End of Session   Activity Tolerance: Patient tolerated treatment well Patient left: in chair;with call bell/phone within reach     Time: 5208-0223 PT Time Calculation (min): 32 min  Charges:  $Gait Training: 8-22 mins $Therapeutic Exercise: 8-22 mins                    G Codes:      [employer], [name]  Jacqueline [name] 07/13/2014, 10:03 AM

## 2014-07-15 ENCOUNTER — Encounter (HOSPITAL_COMMUNITY): Payer: Self-pay | Admitting: Orthopedic Surgery

## 2014-07-15 NOTE — Discharge Summary (Signed)
SPORTS MEDICINE & JOINT REPLACEMENT   Jacqueline Mulch, MD   Jacqueline Spry, PA-C [place], [place], [place]  [address]                             [phone]  PATIENT ID: Jacqueline [name]        MRN:  [id number]          DOB/AGE: June 03, 1966 / 48 y.o.    DISCHARGE SUMMARY  ADMISSION DATE:    07/12/2014 DISCHARGE DATE: 07/13/2014  ADMISSION DIAGNOSIS: osteoarthritis right knee    DISCHARGE DIAGNOSIS:  osteoarthritis right knee    ADDITIONAL DIAGNOSIS: Active Problems:   S/P total knee arthroplasty  Past Medical History  Diagnosis Date  . PONV (postoperative nausea and vomiting)     usually needs patch  . Arthritis     rheumatoid  . Cancer     melanoma rem rt shoulder    PROCEDURE: Procedure(s): RIGHT TOTAL KNEE ARTHROPLASTY on 07/12/2014  CONSULTS:     HISTORY:  See H&P in chart  HOSPITAL COURSE:  Jacqueline [name] is a 48 y.o. admitted on 07/12/2014 and found to have a diagnosis of osteoarthritis right knee.  After appropriate laboratory studies were obtained  they were taken to the operating room on 07/12/2014 and underwent Procedure(s): RIGHT TOTAL KNEE ARTHROPLASTY.   They were given perioperative antibiotics:  Anti-infectives   Start     Dose/Rate Route Frequency Ordered Stop   07/12/14 1400  ceFAZolin (ANCEF) IVPB 2 g/50 mL premix     2 g 100 mL/hr over 30 Minutes Intravenous Every 6 hours 07/12/14 1135 07/12/14 2257   07/12/14 0600  ceFAZolin (ANCEF) IVPB 2 g/50 mL premix     2 g 100 mL/hr over 30 Minutes Intravenous On call to O.R. 07/11/14 1357 07/12/14 0734    .  Tolerated the procedure well.  Placed with a foley intraoperatively.  Given Ofirmev at induction and for 48 hours.    POD# 1: Vital signs were stable.  Patient denied Chest pain, shortness of breath, or calf pain.  Patient was started on Lovenox 30 mg subcutaneously twice daily at 8am.  Consults to PT, OT, and care management were made.  The patient was weight bearing as tolerated.  CPM was  placed on the operative leg 0-90 degrees for 6-8 hours a day.  Incentive spirometry was taught.  Dressing was changed.  Hemovac was discontinued.      Continued  PT for ambulation and exercise program.  IV saline locked.  O2 discontinued.    The remainder of the hospital course was dedicated to ambulation and strengthening.   The patient was discharged on 1 day post op in  Good condition.  Blood products given:none  DIAGNOSTIC STUDIES: Recent vital signs: No data found.      Recent laboratory studies:  Recent Labs  07/12/14 1327  WBC 9.4  HGB 10.4*  HCT 32.0*  PLT 265    Recent Labs  07/12/14 1327  CREATININE 0.60   Lab Results  Component Value Date   INR 1.00 07/02/2014   INR 0.93 03/19/2014     Recent Radiographic Studies :  No results found.  DISCHARGE INSTRUCTIONS: Discharge Instructions   CPM    Complete by:  As directed   Continuous passive motion machine (CPM):      Use the CPM from 0 to 90 for 6-8 hours per day.  You may increase by 10 per day.  You may break it up into 2 or 3 sessions per day.      Use CPM for 2 weeks or until you are told to stop.     Call MD / Call 911    Complete by:  As directed   If you experience chest pain or shortness of breath, CALL 911 and be transported to the hospital emergency room.  If you develope a fever above 101 F, pus (white drainage) or increased drainage or redness at the wound, or calf pain, call your surgeon's office.     Change dressing    Complete by:  As directed   Change dressing on wednesday, then change the dressing daily with sterile 4 x 4 inch gauze dressing and apply TED hose.     Constipation Prevention    Complete by:  As directed   Drink plenty of fluids.  Prune juice may be helpful.  You may use a stool softener, such as Colace (over the counter) 100 mg twice a day.  Use MiraLax (over the counter) for constipation as needed.     Diet - low sodium heart healthy    Complete by:  As directed      Do  not put a pillow under the knee. Place it under the heel.    Complete by:  As directed      Driving restrictions    Complete by:  As directed   No driving for 6 weeks     Increase activity slowly as tolerated    Complete by:  As directed      Lifting restrictions    Complete by:  As directed   No lifting for 6 weeks     TED hose    Complete by:  As directed   Use stockings (TED hose) for 3 weeks on both leg(s).  You may remove them at night for sleeping.           DISCHARGE MEDICATIONS:     Medication List    STOP taking these medications       traMADol 50 MG tablet  Commonly known as:  ULTRAM      TAKE these medications       aspirin EC 325 MG tablet  Take 1 tablet (325 mg total) by mouth daily.     CALCIUM 600 + D PO  Take 1 tablet by mouth daily.     folic acid 094 MCG tablet  Commonly known as:  FOLVITE  Take 800 mcg by mouth daily.     HYDROcodone-acetaminophen 10-325 MG per tablet  Commonly known as:  NORCO  Take 1-2 tablets by mouth every 4 (four) hours as needed (breakthrough pain).     ibuprofen 200 MG tablet  Commonly known as:  ADVIL,MOTRIN  Take 600 mg by mouth every 6 (six) hours as needed for fever, headache or mild pain.     methocarbamol 500 MG tablet  Commonly known as:  ROBAXIN  Take 1 tablet (500 mg total) by mouth every 6 (six) hours as needed for muscle spasms.     methotrexate 2.5 MG tablet  Commonly known as:  RHEUMATREX  Take 7.5 mg by mouth 2 (two) times a week. Friday & Saturday - Caution:Chemotherapy. Protect from light.     pravastatin 20 MG tablet  Commonly known as:  PRAVACHOL  Take 20 mg by mouth daily.     predniSONE 5 MG tablet  Commonly known  as:  DELTASONE  Take 5 mg by mouth daily with breakfast.     vitamin C 500 MG tablet  Commonly known as:  ASCORBIC ACID  Take 500 mg by mouth daily.     zaleplon 5 MG capsule  Commonly known as:  SONATA  Take 1 capsule (5 mg total) by mouth at bedtime as needed for sleep.         FOLLOW UP VISIT:       Follow-up Information   Follow up with Rudean Haskell, MD. Call on 07/27/2014.   Specialty:  Orthopedic Surgery   Contact information:   [address] [place] [address] [phone]       Follow up with [facility].   Contact information:   [address] [place] [address] [phone]       DISPOSITION: HOME   CONDITION:  Good   Obera Stauch 07/15/2014, 8:41 AM

## 2015-11-17 DIAGNOSIS — M25511 Pain in right shoulder: Secondary | ICD-10-CM | POA: Insufficient documentation

## 2015-11-17 DIAGNOSIS — M25512 Pain in left shoulder: Secondary | ICD-10-CM | POA: Insufficient documentation

## 2015-11-17 DIAGNOSIS — M19011 Primary osteoarthritis, right shoulder: Secondary | ICD-10-CM | POA: Diagnosis not present

## 2015-11-17 DIAGNOSIS — M19012 Primary osteoarthritis, left shoulder: Secondary | ICD-10-CM | POA: Diagnosis not present

## 2015-12-29 IMAGING — CR DG CHEST 2V
2 series · 2 of 2 positions shown · non-contrast
Comparison: None.

CLINICAL DATA: Preop left knee.

EXAM:
CHEST  2 VIEW

[w chest pa]
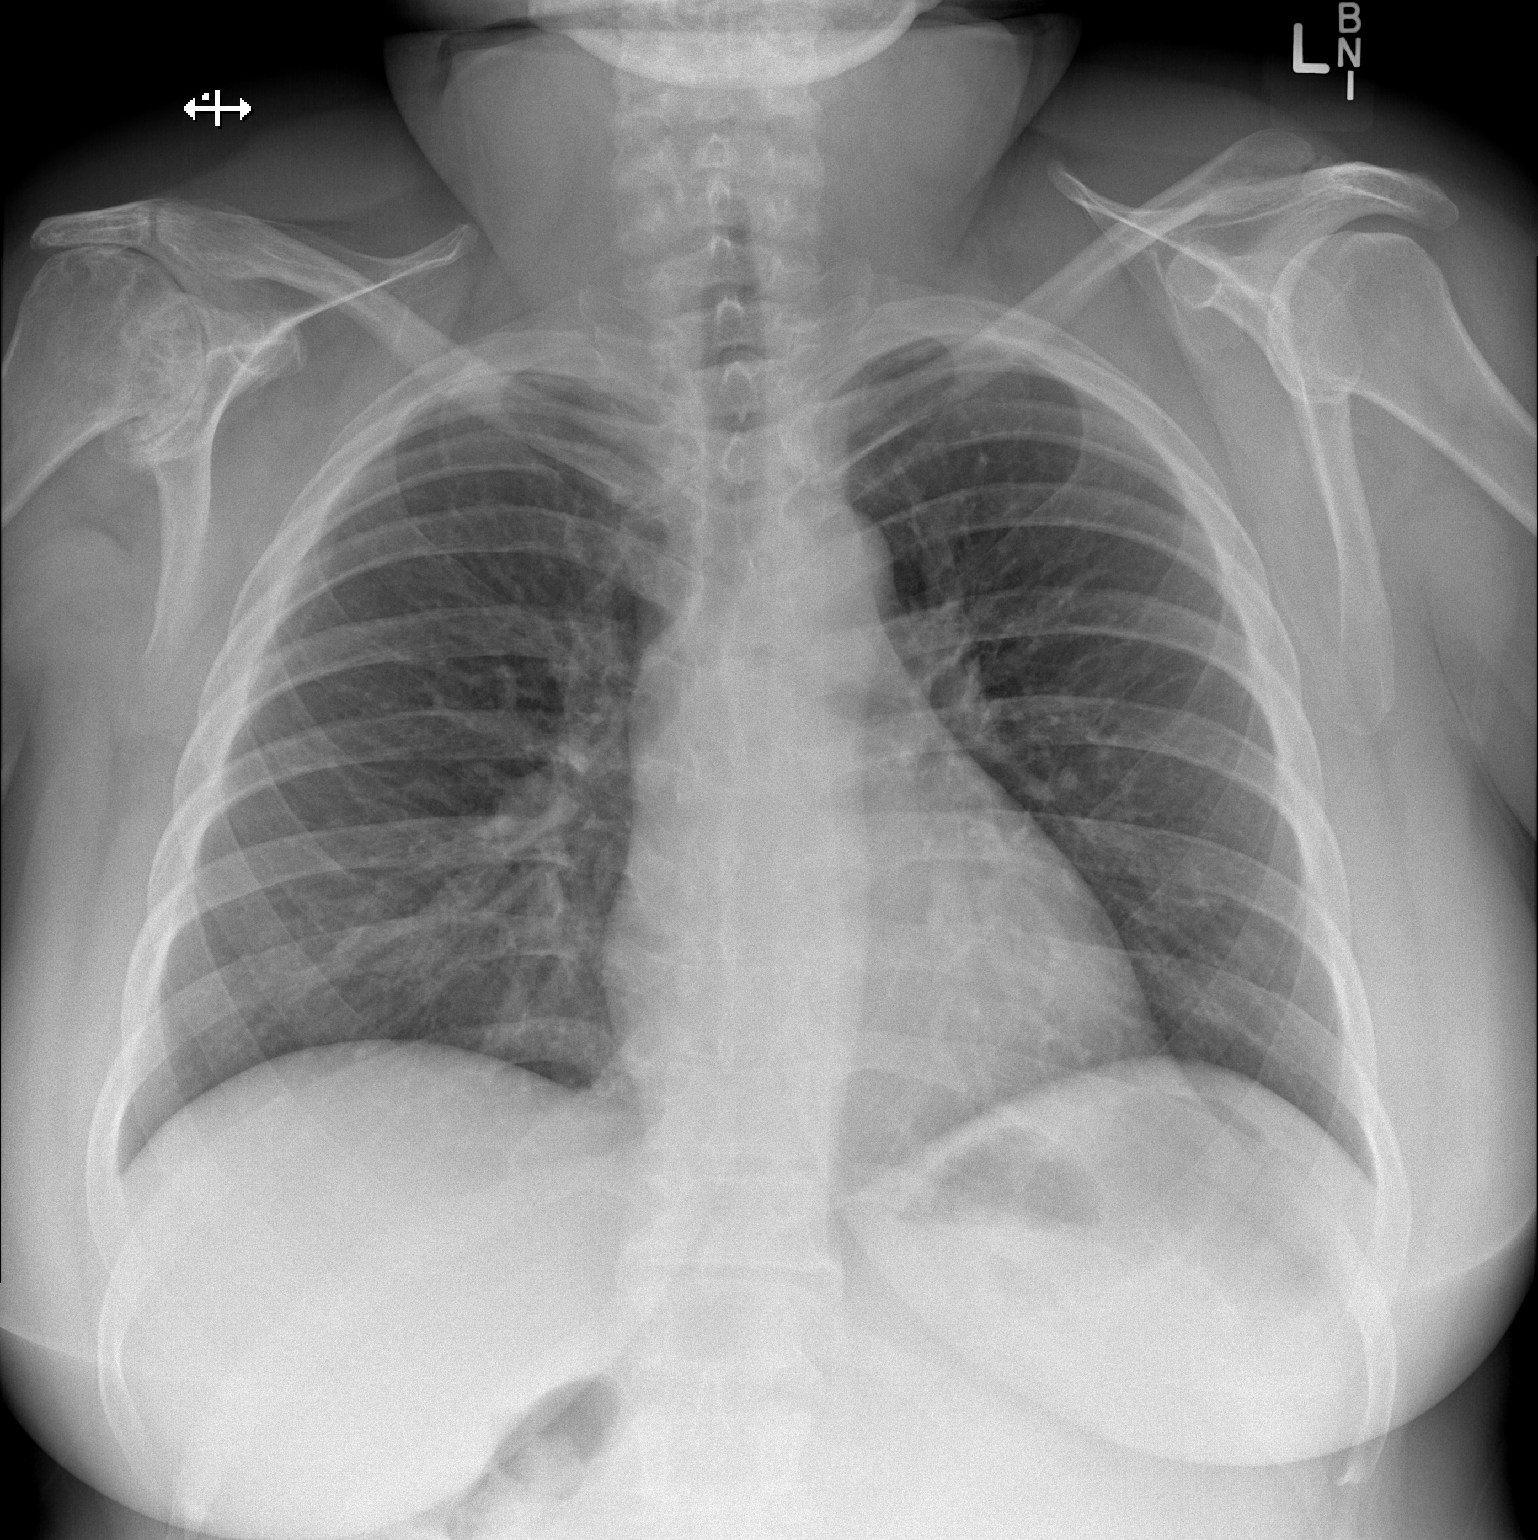

[w chest lat]
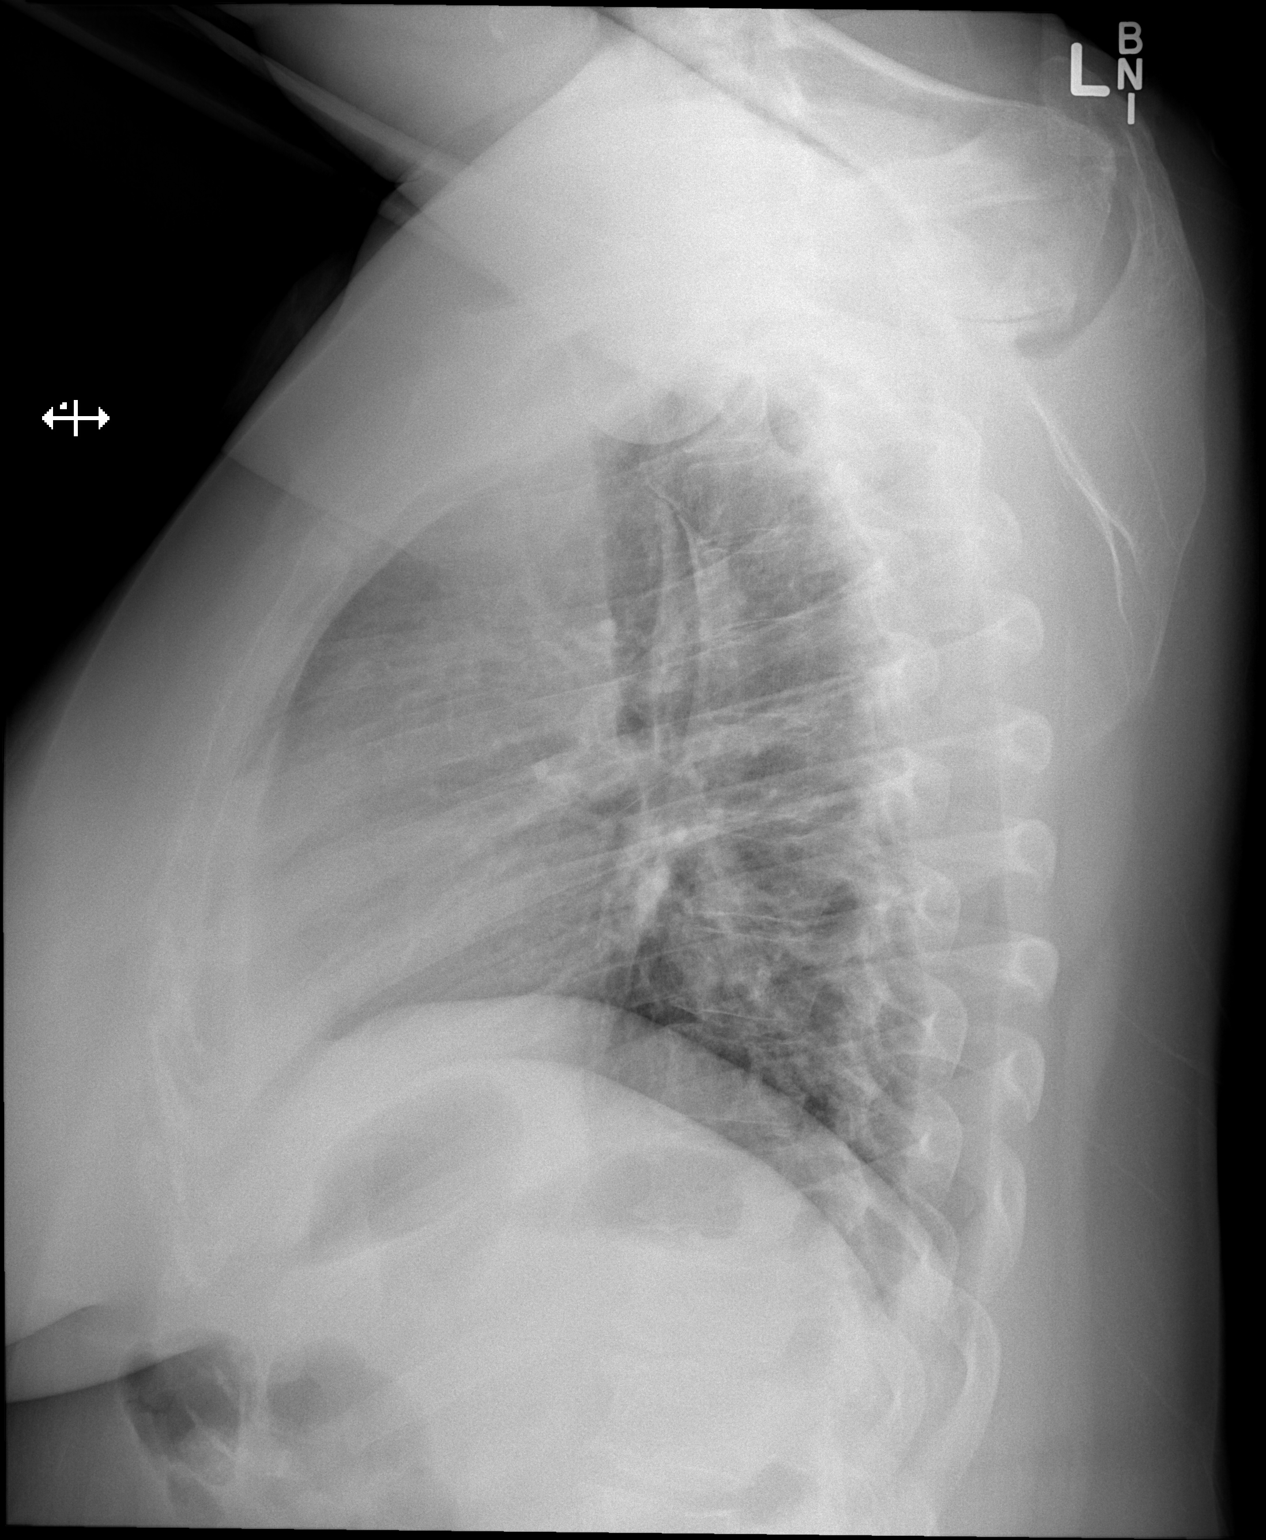

[2 of 2 positions shown; findings below may reference images not displayed]

FINDINGS: Lungs are adequately inflated and otherwise clear. Cardiomediastinal
silhouette is within normal. There are moderate degenerative changes
of the right glenohumeral joint and AC joints. Severe decreased
acromiohumeral joint space.
IMPRESSION: No acute cardiopulmonary disease.

## 2016-01-07 DIAGNOSIS — Z1231 Encounter for screening mammogram for malignant neoplasm of breast: Secondary | ICD-10-CM | POA: Diagnosis not present

## 2016-01-07 DIAGNOSIS — M05741 Rheumatoid arthritis with rheumatoid factor of right hand without organ or systems involvement: Secondary | ICD-10-CM | POA: Diagnosis not present

## 2016-01-07 DIAGNOSIS — Z6834 Body mass index (BMI) 34.0-34.9, adult: Secondary | ICD-10-CM | POA: Diagnosis not present

## 2016-01-07 DIAGNOSIS — E785 Hyperlipidemia, unspecified: Secondary | ICD-10-CM | POA: Diagnosis not present

## 2016-01-07 DIAGNOSIS — M19011 Primary osteoarthritis, right shoulder: Secondary | ICD-10-CM | POA: Diagnosis not present

## 2016-01-13 DIAGNOSIS — Z1231 Encounter for screening mammogram for malignant neoplasm of breast: Secondary | ICD-10-CM | POA: Diagnosis not present

## 2016-02-28 DIAGNOSIS — M0579 Rheumatoid arthritis with rheumatoid factor of multiple sites without organ or systems involvement: Secondary | ICD-10-CM | POA: Diagnosis not present

## 2016-02-28 DIAGNOSIS — Z79899 Other long term (current) drug therapy: Secondary | ICD-10-CM | POA: Diagnosis not present

## 2016-02-29 DIAGNOSIS — M1993 Secondary osteoarthritis, unspecified site: Secondary | ICD-10-CM | POA: Diagnosis not present

## 2016-02-29 DIAGNOSIS — C4361 Malignant melanoma of right upper limb, including shoulder: Secondary | ICD-10-CM | POA: Diagnosis not present

## 2016-02-29 DIAGNOSIS — M79641 Pain in right hand: Secondary | ICD-10-CM | POA: Diagnosis not present

## 2016-02-29 DIAGNOSIS — M2550 Pain in unspecified joint: Secondary | ICD-10-CM | POA: Diagnosis not present

## 2016-02-29 DIAGNOSIS — M0579 Rheumatoid arthritis with rheumatoid factor of multiple sites without organ or systems involvement: Secondary | ICD-10-CM | POA: Diagnosis not present

## 2016-02-29 DIAGNOSIS — M79642 Pain in left hand: Secondary | ICD-10-CM | POA: Diagnosis not present

## 2016-03-13 DIAGNOSIS — Z01419 Encounter for gynecological examination (general) (routine) without abnormal findings: Secondary | ICD-10-CM | POA: Diagnosis not present

## 2016-03-20 DIAGNOSIS — L600 Ingrowing nail: Secondary | ICD-10-CM | POA: Diagnosis not present

## 2016-04-13 DIAGNOSIS — M05741 Rheumatoid arthritis with rheumatoid factor of right hand without organ or systems involvement: Secondary | ICD-10-CM | POA: Diagnosis not present

## 2016-04-13 DIAGNOSIS — M19011 Primary osteoarthritis, right shoulder: Secondary | ICD-10-CM | POA: Diagnosis not present

## 2016-04-13 DIAGNOSIS — Z6835 Body mass index (BMI) 35.0-35.9, adult: Secondary | ICD-10-CM | POA: Diagnosis not present

## 2016-04-13 DIAGNOSIS — E785 Hyperlipidemia, unspecified: Secondary | ICD-10-CM | POA: Diagnosis not present

## 2016-04-13 DIAGNOSIS — Z1389 Encounter for screening for other disorder: Secondary | ICD-10-CM | POA: Diagnosis not present

## 2016-04-16 DIAGNOSIS — M0579 Rheumatoid arthritis with rheumatoid factor of multiple sites without organ or systems involvement: Secondary | ICD-10-CM | POA: Diagnosis not present

## 2016-04-16 DIAGNOSIS — C4361 Malignant melanoma of right upper limb, including shoulder: Secondary | ICD-10-CM | POA: Diagnosis not present

## 2016-04-16 DIAGNOSIS — Z7952 Long term (current) use of systemic steroids: Secondary | ICD-10-CM | POA: Diagnosis not present

## 2016-04-16 DIAGNOSIS — M1993 Secondary osteoarthritis, unspecified site: Secondary | ICD-10-CM | POA: Diagnosis not present

## 2016-04-16 DIAGNOSIS — M2550 Pain in unspecified joint: Secondary | ICD-10-CM | POA: Diagnosis not present

## 2016-05-08 DIAGNOSIS — M05741 Rheumatoid arthritis with rheumatoid factor of right hand without organ or systems involvement: Secondary | ICD-10-CM | POA: Diagnosis not present

## 2016-05-08 DIAGNOSIS — Z6835 Body mass index (BMI) 35.0-35.9, adult: Secondary | ICD-10-CM | POA: Diagnosis not present

## 2016-05-08 DIAGNOSIS — E669 Obesity, unspecified: Secondary | ICD-10-CM | POA: Diagnosis not present

## 2016-07-20 DIAGNOSIS — J302 Other seasonal allergic rhinitis: Secondary | ICD-10-CM | POA: Diagnosis not present

## 2016-07-20 DIAGNOSIS — Z6835 Body mass index (BMI) 35.0-35.9, adult: Secondary | ICD-10-CM | POA: Diagnosis not present

## 2016-07-20 DIAGNOSIS — M1993 Secondary osteoarthritis, unspecified site: Secondary | ICD-10-CM | POA: Diagnosis not present

## 2016-07-20 DIAGNOSIS — M05741 Rheumatoid arthritis with rheumatoid factor of right hand without organ or systems involvement: Secondary | ICD-10-CM | POA: Diagnosis not present

## 2016-07-20 DIAGNOSIS — M19011 Primary osteoarthritis, right shoulder: Secondary | ICD-10-CM | POA: Diagnosis not present

## 2016-07-20 DIAGNOSIS — E785 Hyperlipidemia, unspecified: Secondary | ICD-10-CM | POA: Diagnosis not present

## 2016-09-13 DIAGNOSIS — D485 Neoplasm of uncertain behavior of skin: Secondary | ICD-10-CM | POA: Diagnosis not present

## 2016-09-13 DIAGNOSIS — D225 Melanocytic nevi of trunk: Secondary | ICD-10-CM | POA: Diagnosis not present

## 2016-11-03 DIAGNOSIS — E785 Hyperlipidemia, unspecified: Secondary | ICD-10-CM | POA: Diagnosis not present

## 2016-11-03 DIAGNOSIS — M1993 Secondary osteoarthritis, unspecified site: Secondary | ICD-10-CM | POA: Diagnosis not present

## 2016-11-03 DIAGNOSIS — M19011 Primary osteoarthritis, right shoulder: Secondary | ICD-10-CM | POA: Diagnosis not present

## 2016-11-03 DIAGNOSIS — M05741 Rheumatoid arthritis with rheumatoid factor of right hand without organ or systems involvement: Secondary | ICD-10-CM | POA: Diagnosis not present

## 2017-02-02 DIAGNOSIS — M19011 Primary osteoarthritis, right shoulder: Secondary | ICD-10-CM | POA: Diagnosis not present

## 2017-02-02 DIAGNOSIS — E785 Hyperlipidemia, unspecified: Secondary | ICD-10-CM | POA: Diagnosis not present

## 2017-02-02 DIAGNOSIS — M1993 Secondary osteoarthritis, unspecified site: Secondary | ICD-10-CM | POA: Diagnosis not present

## 2017-02-02 DIAGNOSIS — M05741 Rheumatoid arthritis with rheumatoid factor of right hand without organ or systems involvement: Secondary | ICD-10-CM | POA: Diagnosis not present

## 2017-02-02 DIAGNOSIS — Z6835 Body mass index (BMI) 35.0-35.9, adult: Secondary | ICD-10-CM | POA: Diagnosis not present

## 2017-02-02 DIAGNOSIS — Z1231 Encounter for screening mammogram for malignant neoplasm of breast: Secondary | ICD-10-CM | POA: Diagnosis not present

## 2017-02-18 DIAGNOSIS — Z1231 Encounter for screening mammogram for malignant neoplasm of breast: Secondary | ICD-10-CM | POA: Diagnosis not present

## 2017-02-23 DIAGNOSIS — M12811 Other specific arthropathies, not elsewhere classified, right shoulder: Secondary | ICD-10-CM | POA: Diagnosis not present

## 2017-03-27 ENCOUNTER — Encounter (HOSPITAL_COMMUNITY)
Admission: RE | Admit: 2017-03-27 | Discharge: 2017-03-27 | Disposition: A | Discharge status: Home / Self Care | Payer: PPO | Source: Ambulatory Visit | Attending: Orthopedic Surgery | Admitting: Orthopedic Surgery

## 2017-03-27 DIAGNOSIS — Z01812 Encounter for preprocedural laboratory examination: Secondary | ICD-10-CM | POA: Insufficient documentation

## 2017-03-27 DIAGNOSIS — Z01810 Encounter for preprocedural cardiovascular examination: Secondary | ICD-10-CM | POA: Insufficient documentation

## 2017-03-27 DIAGNOSIS — R001 Bradycardia, unspecified: Secondary | ICD-10-CM | POA: Diagnosis not present

## 2017-03-27 LAB — BASIC METABOLIC PANEL
Anion gap: 9 (ref 5–15)
BUN: 15 mg/dL (ref 6–20)
CO2: 25 mmol/L (ref 22–32)
CREATININE: 0.79 mg/dL (ref 0.44–1.00)
Calcium: 9.2 mg/dL (ref 8.9–10.3)
Chloride: 104 mmol/L (ref 101–111)
GFR calc Af Amer: >60 mL/min (ref >60)
GLUCOSE: 98 mg/dL (ref 65–99)
Potassium: 4.1 mmol/L (ref 3.5–5.1)
Sodium: 138 mmol/L (ref 135–145)

## 2017-03-27 LAB — CBC
HEMATOCRIT: 38.5 % (ref 36.0–46.0)
Hemoglobin: 12.4 g/dL (ref 12.0–15.0)
MCH: 30.8 pg (ref 26.0–34.0)
MCHC: 32.2 g/dL (ref 30.0–36.0)
MCV: 95.5 fL (ref 78.0–100.0)
PLATELETS: 281 10*3/uL (ref 150–400)
RBC: 4.03 MIL/uL (ref 3.87–5.11)
RDW: 14.8 % (ref 11.5–15.5)
WBC: 8.9 10*3/uL (ref 4.0–10.5)

## 2017-03-27 LAB — SURGICAL PCR SCREEN
MRSA, PCR: NEGATIVE
STAPHYLOCOCCUS AUREUS: POSITIVE — AB

## 2017-03-27 NOTE — Pre-Procedure Instructions (Signed)
    Jacqueline [name]  03/27/2017      [facility] [address] [place] [address] [place] [address] Phone: [phone] Fax: [phone]    Your procedure is scheduled on June 7.  Report to [facility] at 5:30 A.M.  Call this number if you have problems the morning of surgery:  [phone]   Remember:  Do not eat food or drink liquids after midnight.  Take these medicines the morning of surgery with A SIP OF WATER : If needed- HYDROcodone-acetaminophen (NORCO), predniSONE  (DELTASONE)    STOP aspirin, herbal medications, vitamins, meloxicam(mobic) May 31   Do not wear jewelry, make-up or nail polish.  Do not wear lotions, powders, or perfumes, or deoderant.  Do not shave 48 hours prior to surgery.  Men may shave face and neck.  Do not bring valuables to the hospital.  [facility] is not responsible for any belongings or valuables.  Contacts, dentures or bridgework may not be worn into surgery.  Leave your suitcase in the car.  After surgery it may be brought to your room.  For patients admitted to the hospital, discharge time will be determined by your treatment team.  Patients discharged the day of surgery will not be allowed to drive home.   Name and phone number of your driver:    Special instructions:  Preparing for surgery  Please read over the following fact sheets that you were given. Pain Booklet and Surgical Site Infection Prevention

## 2017-03-27 NOTE — Progress Notes (Signed)
+  MSSA, rx called to [facility] at [phone], voice message left with pt at [phone]

## 2017-04-03 MED ORDER — TRANEXAMIC ACID 1000 MG/10ML IV SOLN
1000.0000 mg | INTRAVENOUS | Status: AC
  Administered 2017-04-04: 1000 mg via INTRAVENOUS
  Filled 2017-04-03: qty 10

## 2017-04-03 MED ORDER — CEFAZOLIN SODIUM-DEXTROSE 2-4 GM/100ML-% IV SOLN
2.0000 g | INTRAVENOUS | Status: AC
  Administered 2017-04-04: 2 g via INTRAVENOUS
  Filled 2017-04-03: qty 100

## 2017-04-04 ENCOUNTER — Inpatient Hospital Stay (HOSPITAL_COMMUNITY)
Admission: RE | Admit: 2017-04-04 | Discharge: 2017-04-05 | DRG: 483 | Disposition: A | Discharge status: Home / Self Care | Payer: PPO | Source: Ambulatory Visit | Attending: Orthopedic Surgery | Admitting: Orthopedic Surgery

## 2017-04-04 ENCOUNTER — Inpatient Hospital Stay (HOSPITAL_COMMUNITY): Payer: PPO | Admitting: Certified Registered Nurse Anesthetist

## 2017-04-04 ENCOUNTER — Encounter (HOSPITAL_COMMUNITY): Payer: Self-pay | Admitting: *Deleted

## 2017-04-04 ENCOUNTER — Encounter (HOSPITAL_COMMUNITY)
Admission: RE | Disposition: A | Discharge status: Home / Self Care | Payer: Self-pay | Source: Ambulatory Visit | Attending: Orthopedic Surgery

## 2017-04-04 DIAGNOSIS — M069 Rheumatoid arthritis, unspecified: Secondary | ICD-10-CM | POA: Diagnosis not present

## 2017-04-04 DIAGNOSIS — Z7952 Long term (current) use of systemic steroids: Secondary | ICD-10-CM

## 2017-04-04 DIAGNOSIS — Z96653 Presence of artificial knee joint, bilateral: Secondary | ICD-10-CM | POA: Diagnosis present

## 2017-04-04 DIAGNOSIS — E039 Hypothyroidism, unspecified: Secondary | ICD-10-CM | POA: Diagnosis not present

## 2017-04-04 DIAGNOSIS — Z888 Allergy status to other drugs, medicaments and biological substances status: Secondary | ICD-10-CM

## 2017-04-04 DIAGNOSIS — Z79899 Other long term (current) drug therapy: Secondary | ICD-10-CM | POA: Diagnosis not present

## 2017-04-04 DIAGNOSIS — Z85820 Personal history of malignant melanoma of skin: Secondary | ICD-10-CM | POA: Diagnosis not present

## 2017-04-04 DIAGNOSIS — Z885 Allergy status to narcotic agent status: Secondary | ICD-10-CM

## 2017-04-04 DIAGNOSIS — M75101 Unspecified rotator cuff tear or rupture of right shoulder, not specified as traumatic: Secondary | ICD-10-CM | POA: Diagnosis not present

## 2017-04-04 DIAGNOSIS — G8918 Other acute postprocedural pain: Secondary | ICD-10-CM | POA: Diagnosis not present

## 2017-04-04 DIAGNOSIS — M12811 Other specific arthropathies, not elsewhere classified, right shoulder: Secondary | ICD-10-CM | POA: Diagnosis not present

## 2017-04-04 DIAGNOSIS — Z96611 Presence of right artificial shoulder joint: Secondary | ICD-10-CM

## 2017-04-04 HISTORY — PX: REVERSE SHOULDER ARTHROPLASTY: SHX5054

## 2017-04-04 SURGERY — ARTHROPLASTY, SHOULDER, TOTAL, REVERSE
Anesthesia: General | Site: Shoulder | Laterality: Right

## 2017-04-04 MED ORDER — PROPOFOL 10 MG/ML IV BOLUS
INTRAVENOUS | Status: AC
  Filled 2017-04-04: qty 20

## 2017-04-04 MED ORDER — FENTANYL CITRATE (PF) 250 MCG/5ML IJ SOLN
INTRAMUSCULAR | Status: AC
  Filled 2017-04-04: qty 5

## 2017-04-04 MED ORDER — ALUM & MAG HYDROXIDE-SIMETH 200-200-20 MG/5ML PO SUSP: 30.0000 mL | ORAL | Status: DC | PRN

## 2017-04-04 MED ORDER — ACETAMINOPHEN 650 MG RE SUPP: 650.0000 mg | Freq: Four times a day (QID) | RECTAL | Status: DC | PRN

## 2017-04-04 MED ORDER — PHENOL 1.4 % MT LIQD: 1.0000 | OROMUCOSAL | Status: DC | PRN

## 2017-04-04 MED ORDER — 0.9 % SODIUM CHLORIDE (POUR BTL) OPTIME
TOPICAL | Status: DC | PRN
  Administered 2017-04-04: 1000 mL

## 2017-04-04 MED ORDER — HYDROMORPHONE HCL 1 MG/ML IJ SOLN: 1.0000 mg | INTRAMUSCULAR | Status: DC | PRN

## 2017-04-04 MED ORDER — GLYCOPYRROLATE 0.2 MG/ML IJ SOLN
INTRAMUSCULAR | Status: DC | PRN
  Administered 2017-04-04: .1 mg via INTRAVENOUS
  Administered 2017-04-04: 0.2 mg via INTRAVENOUS

## 2017-04-04 MED ORDER — SCOPOLAMINE 1 MG/3DAYS TD PT72
MEDICATED_PATCH | TRANSDERMAL | Status: DC | PRN
  Administered 2017-04-04: 1 via TRANSDERMAL

## 2017-04-04 MED ORDER — ROCURONIUM BROMIDE 100 MG/10ML IV SOLN
INTRAVENOUS | Status: DC | PRN
  Administered 2017-04-04: 50 mg via INTRAVENOUS

## 2017-04-04 MED ORDER — MIDAZOLAM HCL 2 MG/2ML IJ SOLN
INTRAMUSCULAR | Status: AC
  Filled 2017-04-04: qty 2

## 2017-04-04 MED ORDER — DOCUSATE SODIUM 100 MG PO CAPS
100.0000 mg | ORAL_CAPSULE | Freq: Two times a day (BID) | ORAL | Status: DC
  Administered 2017-04-04 – 2017-04-05 (×2): 100 mg via ORAL
  Filled 2017-04-04 (×2): qty 1

## 2017-04-04 MED ORDER — ONDANSETRON HCL 4 MG/2ML IJ SOLN: 4.0000 mg | Freq: Four times a day (QID) | INTRAMUSCULAR | Status: DC | PRN

## 2017-04-04 MED ORDER — LACTATED RINGERS IV SOLN
INTRAVENOUS | Status: DC | PRN
  Administered 2017-04-04 (×2): via INTRAVENOUS

## 2017-04-04 MED ORDER — ONDANSETRON HCL 4 MG PO TABS: 4.0000 mg | ORAL_TABLET | Freq: Four times a day (QID) | ORAL | Status: DC | PRN

## 2017-04-04 MED ORDER — PREDNISONE 5 MG PO TABS
5.0000 mg | ORAL_TABLET | Freq: Every day | ORAL | Status: DC
  Administered 2017-04-05: 5 mg via ORAL
  Filled 2017-04-04: qty 1

## 2017-04-04 MED ORDER — HYDROMORPHONE HCL 2 MG PO TABS
2.0000 mg | ORAL_TABLET | ORAL | Status: DC | PRN
  Administered 2017-04-05 (×2): 2 mg via ORAL
  Filled 2017-04-04 (×2): qty 1

## 2017-04-04 MED ORDER — FENTANYL CITRATE (PF) 250 MCG/5ML IJ SOLN
INTRAMUSCULAR | Status: DC | PRN
  Administered 2017-04-04 (×2): 50 ug via INTRAVENOUS

## 2017-04-04 MED ORDER — MAGNESIUM CITRATE PO SOLN: 1.0000 | Freq: Once | ORAL | Status: DC | PRN

## 2017-04-04 MED ORDER — CHLORHEXIDINE GLUCONATE 4 % EX LIQD: 60.0000 mL | Freq: Once | CUTANEOUS | Status: DC

## 2017-04-04 MED ORDER — MENTHOL 3 MG MT LOZG: 1.0000 | LOZENGE | OROMUCOSAL | Status: DC | PRN

## 2017-04-04 MED ORDER — BUPIVACAINE-EPINEPHRINE (PF) 0.5% -1:200000 IJ SOLN
INTRAMUSCULAR | Status: DC | PRN
  Administered 2017-04-04: 25 mL via PERINEURAL

## 2017-04-04 MED ORDER — DIAZEPAM 5 MG PO TABS: 5.0000 mg | ORAL_TABLET | Freq: Four times a day (QID) | ORAL | Status: DC | PRN

## 2017-04-04 MED ORDER — PROMETHAZINE HCL 25 MG/ML IJ SOLN: 6.2500 mg | INTRAMUSCULAR | Status: DC | PRN

## 2017-04-04 MED ORDER — DIPHENHYDRAMINE HCL 12.5 MG/5ML PO ELIX: 12.5000 mg | ORAL_SOLUTION | ORAL | Status: DC | PRN

## 2017-04-04 MED ORDER — MIDAZOLAM HCL 5 MG/5ML IJ SOLN
INTRAMUSCULAR | Status: DC | PRN
  Administered 2017-04-04: 2 mg via INTRAVENOUS

## 2017-04-04 MED ORDER — ATORVASTATIN CALCIUM 40 MG PO TABS
40.0000 mg | ORAL_TABLET | Freq: Every day | ORAL | Status: DC
  Administered 2017-04-04: 40 mg via ORAL
  Filled 2017-04-04: qty 1

## 2017-04-04 MED ORDER — ZOLPIDEM TARTRATE 5 MG PO TABS
5.0000 mg | ORAL_TABLET | Freq: Every evening | ORAL | Status: DC | PRN
  Administered 2017-04-04: 5 mg via ORAL
  Filled 2017-04-04: qty 1

## 2017-04-04 MED ORDER — PROPOFOL 10 MG/ML IV BOLUS
INTRAVENOUS | Status: DC | PRN
  Administered 2017-04-04: 150 mg via INTRAVENOUS

## 2017-04-04 MED ORDER — LACTATED RINGERS IV SOLN
INTRAVENOUS | Status: DC
  Administered 2017-04-04 (×2): via INTRAVENOUS

## 2017-04-04 MED ORDER — BISACODYL 5 MG PO TBEC: 5.0000 mg | DELAYED_RELEASE_TABLET | Freq: Every day | ORAL | Status: DC | PRN

## 2017-04-04 MED ORDER — DEXAMETHASONE SODIUM PHOSPHATE 10 MG/ML IJ SOLN
INTRAMUSCULAR | Status: DC | PRN
  Administered 2017-04-04: 10 mg via INTRAVENOUS

## 2017-04-04 MED ORDER — POLYETHYLENE GLYCOL 3350 17 G PO PACK: 17.0000 g | PACK | Freq: Every day | ORAL | Status: DC | PRN

## 2017-04-04 MED ORDER — METOCLOPRAMIDE HCL 5 MG/ML IJ SOLN: 5.0000 mg | Freq: Three times a day (TID) | INTRAMUSCULAR | Status: DC | PRN

## 2017-04-04 MED ORDER — SUGAMMADEX SODIUM 200 MG/2ML IV SOLN
INTRAVENOUS | Status: DC | PRN
  Administered 2017-04-04: 200 mg via INTRAVENOUS

## 2017-04-04 MED ORDER — SCOPOLAMINE 1 MG/3DAYS TD PT72
MEDICATED_PATCH | TRANSDERMAL | Status: AC
  Filled 2017-04-04: qty 1

## 2017-04-04 MED ORDER — SUCCINYLCHOLINE CHLORIDE 20 MG/ML IJ SOLN
INTRAMUSCULAR | Status: DC | PRN
  Administered 2017-04-04: 100 mg via INTRAVENOUS

## 2017-04-04 MED ORDER — LIDOCAINE HCL (CARDIAC) 20 MG/ML IV SOLN
INTRAVENOUS | Status: DC | PRN
  Administered 2017-04-04: 60 mg via INTRATRACHEAL

## 2017-04-04 MED ORDER — HYDROMORPHONE HCL 1 MG/ML IJ SOLN: 0.2500 mg | INTRAMUSCULAR | Status: DC | PRN

## 2017-04-04 MED ORDER — SUGAMMADEX SODIUM 200 MG/2ML IV SOLN
INTRAVENOUS | Status: AC
  Filled 2017-04-04: qty 2

## 2017-04-04 MED ORDER — KETOROLAC TROMETHAMINE 15 MG/ML IJ SOLN
15.0000 mg | Freq: Four times a day (QID) | INTRAMUSCULAR | Status: AC
  Administered 2017-04-04 – 2017-04-05 (×4): 15 mg via INTRAVENOUS
  Filled 2017-04-04 (×4): qty 1

## 2017-04-04 MED ORDER — ACETAMINOPHEN 325 MG PO TABS
650.0000 mg | ORAL_TABLET | Freq: Four times a day (QID) | ORAL | Status: DC | PRN
Start: 2017-04-04 — End: 2017-04-05
  Administered 2017-04-04: 650 mg via ORAL
  Filled 2017-04-04: qty 2

## 2017-04-04 MED ORDER — CEFAZOLIN SODIUM-DEXTROSE 2-4 GM/100ML-% IV SOLN
2.0000 g | Freq: Four times a day (QID) | INTRAVENOUS | Status: AC
  Administered 2017-04-04 – 2017-04-05 (×3): 2 g via INTRAVENOUS
  Filled 2017-04-04 (×4): qty 100

## 2017-04-04 MED ORDER — METOCLOPRAMIDE HCL 5 MG PO TABS: 5.0000 mg | ORAL_TABLET | Freq: Three times a day (TID) | ORAL | Status: DC | PRN

## 2017-04-04 MED ORDER — ONDANSETRON HCL 4 MG/2ML IJ SOLN
INTRAMUSCULAR | Status: DC | PRN
  Administered 2017-04-04: 4 mg via INTRAVENOUS

## 2017-04-04 MED ORDER — TRANEXAMIC ACID 1000 MG/10ML IV SOLN
2000.0000 mg | INTRAVENOUS | Status: AC
  Administered 2017-04-04: 2000 mg via TOPICAL
  Filled 2017-04-04: qty 20

## 2017-04-04 MED ORDER — PHENYLEPHRINE HCL 10 MG/ML IJ SOLN
INTRAMUSCULAR | Status: DC | PRN
  Administered 2017-04-04: 25 ug/min via INTRAVENOUS

## 2017-04-04 SURGICAL SUPPLY — 68 items
ADH SKN CLS APL DERMABOND .7 (GAUZE/BANDAGES/DRESSINGS) ×1
AID PSTN UNV HD RSTRNT DISP (MISCELLANEOUS) ×1
BASEPLATE GLENOID SHLDR SM (Shoulder) ×2 IMPLANT
BLADE SAW SGTL 83.5X18.5 (BLADE) ×3 IMPLANT
BSPLAT GLND SM PRFT SHLDR CA (Shoulder) ×1 IMPLANT
COVER SURGICAL LIGHT HANDLE (MISCELLANEOUS) ×3 IMPLANT
CUP SUT UNIV REVERS 36+2 RT (Cup) ×2 IMPLANT
DERMABOND ADVANCED (GAUZE/BANDAGES/DRESSINGS) ×2
DERMABOND ADVANCED .7 DNX12 (GAUZE/BANDAGES/DRESSINGS) ×1 IMPLANT
DRAPE ORTHO SPLIT 77X108 STRL (DRAPES) ×6
DRAPE SURG 17X11 SM STRL (DRAPES) ×3 IMPLANT
DRAPE SURG ORHT 6 SPLT 77X108 (DRAPES) ×2 IMPLANT
DRAPE U-SHAPE 47X51 STRL (DRAPES) ×3 IMPLANT
DRSG AQUACEL AG ADV 3.5X10 (GAUZE/BANDAGES/DRESSINGS) ×3 IMPLANT
DURAPREP 26ML APPLICATOR (WOUND CARE) ×3 IMPLANT
ELECT BLADE 4.0 EZ CLEAN MEGAD (MISCELLANEOUS) ×3
ELECT CAUTERY BLADE 6.4 (BLADE) ×3 IMPLANT
ELECT REM PT RETURN 9FT ADLT (ELECTROSURGICAL) ×3
ELECTRODE BLDE 4.0 EZ CLN MEGD (MISCELLANEOUS) ×1 IMPLANT
ELECTRODE REM PT RTRN 9FT ADLT (ELECTROSURGICAL) ×1 IMPLANT
FACESHIELD WRAPAROUND (MASK) ×9 IMPLANT
FACESHIELD WRAPAROUND OR TEAM (MASK) ×3 IMPLANT
GLENOSPHERE LATERAL 36MM+4 (Shoulder) ×2 IMPLANT
GLOVE BIO SURGEON STRL SZ7.5 (GLOVE) ×3 IMPLANT
GLOVE BIO SURGEON STRL SZ8 (GLOVE) ×3 IMPLANT
GLOVE EUDERMIC 7 POWDERFREE (GLOVE) ×5 IMPLANT
GLOVE SS BIOGEL STRL SZ 7.5 (GLOVE) ×1 IMPLANT
GLOVE SUPERSENSE BIOGEL SZ 7.5 (GLOVE) ×2
GOWN STRL REUS W/ TWL LRG LVL3 (GOWN DISPOSABLE) ×1 IMPLANT
GOWN STRL REUS W/ TWL XL LVL3 (GOWN DISPOSABLE) ×2 IMPLANT
GOWN STRL REUS W/TWL LRG LVL3 (GOWN DISPOSABLE) ×6
GOWN STRL REUS W/TWL XL LVL3 (GOWN DISPOSABLE) ×6
KIT BASIN OR (CUSTOM PROCEDURE TRAY) ×3 IMPLANT
KIT ROOM TURNOVER OR (KITS) ×3 IMPLANT
LINER HUMERAL 36 +3MM SM (Shoulder) ×2 IMPLANT
MANIFOLD NEPTUNE II (INSTRUMENTS) ×3 IMPLANT
NDL HYPO 25GX1X1/2 BEV (NEEDLE) IMPLANT
NDL SUT 6 .5 CRC .975X.05 MAYO (NEEDLE) IMPLANT
NDL TAPERED W/ NITINOL LOOP (MISCELLANEOUS) ×1 IMPLANT
NEEDLE HYPO 25GX1X1/2 BEV (NEEDLE) IMPLANT
NEEDLE MAYO TAPER (NEEDLE)
NEEDLE TAPERED W/ NITINOL LOOP (MISCELLANEOUS) ×3 IMPLANT
NS IRRIG 1000ML POUR BTL (IV SOLUTION) ×3 IMPLANT
PACK SHOULDER (CUSTOM PROCEDURE TRAY) ×3 IMPLANT
PAD ARMBOARD 7.5X6 YLW CONV (MISCELLANEOUS) ×6 IMPLANT
RESTRAINT HEAD UNIVERSAL NS (MISCELLANEOUS) ×3 IMPLANT
SCREW CENTRAL NONLOCK 6.5X20MM (Shoulder) ×2 IMPLANT
SCREW LOCK GLENOID UNI PERI (Screw) ×4 IMPLANT
SET PIN UNIVERSAL REVERSE (SET/KITS/TRAYS/PACK) ×2 IMPLANT
SLING ARM FOAM STRAP LRG (SOFTGOODS) IMPLANT
SLING ARM IMMOBILIZER LRG (SOFTGOODS) ×2 IMPLANT
SPACER SHLD UNI REV 36 +6 (Shoulder) ×1 IMPLANT
SPACER TI 36/+6MM (Shoulder) ×1 IMPLANT
SPONGE LAP 18X18 X RAY DECT (DISPOSABLE) ×3 IMPLANT
SPONGE LAP 4X18 X RAY DECT (DISPOSABLE) ×3 IMPLANT
STEM HUMERAL UNI REVERS SZ6 (Stem) ×2 IMPLANT
SUCTION FRAZIER HANDLE 10FR (MISCELLANEOUS)
SUCTION TUBE FRAZIER 10FR DISP (MISCELLANEOUS) IMPLANT
SUT FIBERWIRE #2 38 T-5 BLUE (SUTURE) ×9
SUT MNCRL AB 3-0 PS2 18 (SUTURE) ×3 IMPLANT
SUT MON AB 2-0 CT1 27 (SUTURE) ×3 IMPLANT
SUT VIC AB 1 CT1 27 (SUTURE) ×3
SUT VIC AB 1 CT1 27XBRD ANBCTR (SUTURE) ×1 IMPLANT
SUTURE FIBERWR #2 38 T-5 BLUE (SUTURE) ×3 IMPLANT
SYR CONTROL 10ML LL (SYRINGE) IMPLANT
TOWEL OR 17X24 6PK STRL BLUE (TOWEL DISPOSABLE) ×3 IMPLANT
TOWEL OR 17X26 10 PK STRL BLUE (TOWEL DISPOSABLE) ×3 IMPLANT
WATER STERILE IRR 1000ML POUR (IV SOLUTION) ×1 IMPLANT

## 2017-04-04 NOTE — H&P (Signed)
Jacqueline [name]    Chief Complaint: Right shoulder rotator cuff tear arthropathy  HPI: The patient is a 51 y.o. female with end stage right shoulder rotator cuff tear arthropathy  Past Medical History:  Diagnosis Date  . Arthritis    rheumatoid  . Cancer ([place])    melanoma rem rt shoulder  . PONV (postoperative nausea and vomiting)    usually needs patch    Past Surgical History:  Procedure Laterality Date  . ABDOMINAL HYSTERECTOMY     partial  . FOOT SURGERY Left 05,08   screws,pins, rt foot 06  . KNEE ARTHROSCOPY Right    cartilage  . melonama Right    shoulder  . TOTAL KNEE ARTHROPLASTY Left 03/29/2014   Procedure: LEFT TOTAL KNEE ARTHROPLASTY;  Surgeon: Jacqueline Huger, MD;  Location: [facility];  Service: Orthopedics;  Laterality: Left;  . TOTAL KNEE ARTHROPLASTY Right 07/12/2014   Procedure: RIGHT TOTAL KNEE ARTHROPLASTY;  Surgeon: Jacqueline Huger, MD;  Location: [facility];  Service: Orthopedics;  Laterality: Right;  . TUBAL LIGATION      History reviewed. No pertinent family history.  Social History:  reports that she has never smoked. She has never used smokeless tobacco. She reports that she does not drink alcohol or use drugs.   Medications Prior to Admission  Medication Sig Dispense Refill  . atorvastatin (LIPITOR) 40 MG tablet Take 40 mg by mouth at bedtime.    . folic acid (FOLVITE) 960 MCG tablet Take 800 mcg by mouth daily.    Marland Kitchen HYDROcodone-acetaminophen (NORCO) 10-325 MG per tablet Take 1-2 tablets by mouth every 4 (four) hours as needed (breakthrough pain). (Patient taking differently: Take 0.5 tablets by mouth 3 (three) times daily as needed for severe pain. ) 100 tablet 0  . meloxicam (MOBIC) 7.5 MG tablet Take 7.5 mg by mouth daily.     . methotrexate (RHEUMATREX) 2.5 MG tablet Take 15 mg by mouth every Friday. Caution:Chemotherapy. Protect from light.    . Multiple Vitamin (MULTIVITAMIN WITH MINERALS) TABS tablet Take 1 tablet by mouth daily.    . predniSONE (DELTASONE) 5  MG tablet Take 5 mg by mouth daily with breakfast.       Physical Exam: right shoulder with painful and restricted motion as noted at recent office visits  Vitals  Temp:  [98.4 F (36.9 C)] 98.4 F (36.9 C) (06/07 0543) Pulse Rate:  [62] 62 (06/07 0543) Resp:  [16] 16 (06/07 0543) BP: (143)/(54) 143/54 (06/07 0543) SpO2:  [100 %] 100 % (06/07 0543) Weight:  [87.1 kg (192 lb)] 87.1 kg (192 lb) (06/07 0543)  Assessment/Plan  Impression: Right shoulder rotator cuff tear arthropathy   Plan of Action: Procedure(s): REVERSE RIGHT SHOULDER ARTHROPLASTY  Jacqueline [name] M Jacqueline [name] 04/04/2017, 7:29 AM Contact # [phone]

## 2017-04-04 NOTE — Op Note (Signed)
NAME:  Jacqueline, [name]                    ACCOUNT NO.:  MEDICAL RECORD NO.:  [id number]  LOCATION:                                 FACILITY:  PHYSICIAN:  Metta Clines. Venancio Chenier, M.D.       DATE OF BIRTH:  DATE OF PROCEDURE:  04/04/2017 DATE OF DISCHARGE:                              OPERATIVE REPORT   PREOPERATIVE DIAGNOSIS:  End-stage right shoulder rotator cuff tear arthropathy.  POSTOPERATIVE DIAGNOSIS:  End-stage right shoulder rotator cuff tear arthropathy.  PROCEDURE:  Right reverse shoulder arthroplasty utilizing a press-fit size 6 Arthrex stem with a +6 extension, +3 poly on the posterior offset base plate with a 36, +4 glenosphere on a small baseplate.  SURGEON:  Metta Clines. Keeghan Bialy, MD.  Terrence DupontReather Laurence. Shuford, PA-C.  ANESTHESIA:  General endotracheal as well as interscalene block.  ESTIMATED BLOOD LOSS:  200 mL.  DRAINS:  None.  HISTORY:  Jacqueline [name] is a 51 year old female, who has underlying rheumatoid arthritis and end-stage right shoulder rotator cuff tear arthropathy with profound restriction, mobility, pain, and functional limitations.  Due to her increasing functional difficulty, she was brought to the operating room at this time, for planned right shoulder reverse arthroplasty.  Preoperatively, I counseled Jacqueline [name] regarding treatment options and potential risks versus benefits thereof.  Possible surgical complications were all reviewed including bleeding, infection, neurovascular injury, persistent pain, loss of motion, anesthetic complication, failure of the implant, and possible need for additional surgery.  She understands and accepts and agrees with our planned procedure.  PROCEDURE IN DETAIL:  After undergoing routine preop evaluation, the patient received prophylactic antibiotics and an interscalene block was established in the holding area by the Anesthesia Department.  Placed supine on the operating table and underwent smooth induction of  a general endotracheal anesthesia.  Placed in the beach-chair position and appropriately padded and protected.  Right shoulder girdle region was sterilely prepped and draped in standard fashion.  Time-out was called. An anterior deltopectoral approach was made to the right shoulder through an approximately 8 cm incision.  Skin flaps were elevated. Dissection carried deeply, electrocautery was used for hemostasis.  The deltopectoral interval was then developed from proximal to distal, being taken laterally.  Upper centimeter of the pec major was tenotomized to enhance exposure.  There were severe deformity about the joint and we performed a tenotomy of the subscapularis, which did not show good viability, so we did not anticipated subscap repair.  The rotator cuff was denuded superiorly.  We removed some residual soft tissue attachments.  We were able to then dissect capsule and tissues from the anteroinferior and inferior aspects of the humeral neck allowing delivery of the head through the wound.  The head, itself, was markedly deformed.  There may have been some elements of avascular necrosis with collapse of the head.  We used our extramedullary guide to make our guide to outline our proposed humeral head resection at approximately 15- 20 degrees retroversion matching the native retroversion.  This was then completed with an oscillating saw.  We removed large osteophytes from the anterior and inferior aspects of the humeral neck, placed a metal  cap over the cut surface of the proximal humerus, then exposed the glenoid.  A combination of Fukuda, pitchfork, and snake tongue retractors.  There was significant glenoid deformity with marked retroversion and very prominent anterior-inferior glenoid spur.  I carefully dissected this free mobilizing soft tissues, then used a rongeur to debulk the majority of the very large bony excrescence off the inferior aspect of the glenoid.  Once this was  completed, we then identified the appropriate starting point for glenoid baseplate.  A central guidepin was then placed.  We performed our central reaming and then peripheral reaming and made some additional adjustments such that we had a solid bony bed for the placement of our baseplate.  The small baseplate was then placed into the properly prepared glenoid.  It was transfixed with a central lag screw and an inferior and superior locking screws, all of which obtained excellent bony fit fixation.  Then, we performed peripheral reaming around the baseplate to confirm complete exposure with no bony impingement.  The wound was then irrigated.  At this time, we then impacted the 36 +4 onto the baseplate with solid fixation.  At this time, we then returned our attention back to the proximal humerus were performed, hand reaming of the canal up to size 7, broached up to size 6, which showed excellent fill of the canal and excellent stability.  Then, we performed reaming of the metaphysis with posterior offset and placed trial implants showing good soft tissue balance.  We then assembled the final implant on the back table, impacted the final implant into the humerus with excellent fit and fixation.  Trial reductions ultimately led [place] to a combination with a +6 extension with the +3 poly and these implants were then attached to our stem and poly was impacted.  Final reduction was performed, which showed excellent soft tissue balance, good shoulder motion, and good stability. The wound was then copiously irrigated.  We did use some topical TXA, in addition to the IV TXA and meticulous hemostasis.  At this point, the deltopectoral interval was then reapproximated with a series of figure- of-eight #1 Vicryl sutures.  There have been previous rupture of long head biceps tenotomy.  Tenodesis was not indicated and again the subscap was felt to be incompetent, so we did not perform subscap repair, so  the deltopectoral interval was then closed.  Subcu layer closed with 2-0 Monocryl, intracuticular 3-0 Monocryl for the skin, followed by Dermabond and Aquacel dressing.  Right arm was placed in a sling.  The patient was then awakened, extubated, and taken to the recovery room in stable condition.  Jenetta Loges, PA-C, was used as an Environmental consultant throughout this case, was essential for help with positioning the patient, positioning the extremity, tissue manipulation, suture management, implantation of prosthesis, wound closure, and intraoperative decision making.     Metta Clines. Vance Hochmuth, M.D.     KMS/MEDQ  D:  04/04/2017  T:  04/04/2017  Job:  [id number]

## 2017-04-04 NOTE — Anesthesia Procedure Notes (Addendum)
Anesthesia Regional Block: Interscalene brachial plexus block   Pre-Anesthetic Checklist: ,, timeout performed, Correct Patient, Correct Site, Correct Laterality, Correct Procedure, Correct Position, site marked, Risks and benefits discussed,  Surgical consent,  Pre-op evaluation,  At surgeon's request and post-op pain management  Laterality: Upper and Right  Prep: chloraprep       Needles:  Injection technique: Single-shot  Needle Type: Echogenic Stimulator Needle     Needle Length: 9cm  Needle Gauge: 21   Needle insertion depth: 5 cm   Additional Needles:   Procedures: ultrasound guided,,,,,,,,   Nerve Stimulator or Paresthesia:  Response: Twitch elicited, 0.5 mA, 0.3 ms,   Additional Responses:   Narrative:  Start time: 04/04/2017 7:00 AM End time: 04/04/2017 7:15 AM Injection made incrementally with aspirations every 5 mL.  Performed by: Personally  Anesthesiologist: Avneet Ashmore  Additional Notes: Block assessed prior to start of surgery

## 2017-04-04 NOTE — Op Note (Signed)
04/04/2017  9:29 AM  PATIENT:   Bernadene Bell  51 y.o. female  PRE-OPERATIVE DIAGNOSIS:  Right shoulder rotator cuff tear arthropathy   POST-OPERATIVE DIAGNOSIS:  same  PROCEDURE:  R RSA #6 stem, +6 extension, +3 poly, 36/+4 glenosphere on small baseplate  SURGEON:  Chrisie Jankovich, Metta Clines M.D.  ASSISTANTS: Shuford pac   ANESTHESIA:   GET + ISB  EBL: 200  SPECIMEN:  none  Drains: none   PATIENT DISPOSITION:  PACU - hemodynamically stable.    PLAN OF CARE: Admit for overnight observation  Dictation# [id number]   Contact # [phone]

## 2017-04-04 NOTE — Anesthesia Postprocedure Evaluation (Signed)
Anesthesia Post Note  Patient: Jacqueline [name]  Procedure(s) Performed: Procedure(s) (LRB): REVERSE RIGHT SHOULDER ARTHROPLASTY (Right)     Patient location during evaluation: PACU Anesthesia Type: General and Regional Level of consciousness: awake and alert Pain management: pain level controlled Vital Signs Assessment: post-procedure vital signs reviewed and stable Respiratory status: spontaneous breathing, nonlabored ventilation, respiratory function stable and patient connected to nasal cannula oxygen Cardiovascular status: blood pressure returned to baseline and stable Postop Assessment: no signs of nausea or vomiting Anesthetic complications: no    Last Vitals:  Vitals:   04/04/17 1015 04/04/17 1047  BP:    Pulse: 68   Resp: 18   Temp:  36.7 C    Last Pain:  Vitals:   04/04/17 0557  TempSrc:   PainSc: 7                  [name],JAMES TERRILL

## 2017-04-04 NOTE — Discharge Instructions (Signed)

## 2017-04-04 NOTE — Transfer of Care (Signed)
Immediate Anesthesia Transfer of Care Note  Patient: Jacqueline [name]  Procedure(s) Performed: Procedure(s): REVERSE RIGHT SHOULDER ARTHROPLASTY (Right)  Patient Location: PACU  Anesthesia Type:GA combined with regional for post-op pain  Level of Consciousness: awake, alert  and oriented  Airway & Oxygen Therapy: Patient Spontanous Breathing and Patient connected to nasal cannula oxygen  Post-op Assessment: Report given to RN and Post -op Vital signs reviewed and stable  Post vital signs: Reviewed and stable  Last Vitals:  Vitals:   04/04/17 0543  BP: (!) 143/54  Pulse: 62  Resp: 16  Temp: 36.9 C    Last Pain:  Vitals:   04/04/17 0557  TempSrc:   PainSc: 7       Patients Stated Pain Goal: 4 (48/27/07 8675)  Complications: No apparent anesthesia complications

## 2017-04-04 NOTE — Anesthesia Preprocedure Evaluation (Addendum)
Anesthesia Evaluation  Patient identified by MRN, date of birth, ID band Patient awake    Reviewed: Allergy & Precautions, NPO status , Patient's Chart, lab work & pertinent test results  History of Anesthesia Complications (+) PONV  Airway Mallampati: III  TM Distance: <3 FB Neck ROM: Limited  Mouth opening: Limited Mouth Opening  Dental no notable dental hx.    Pulmonary    breath sounds clear to auscultation       Cardiovascular negative cardio ROS   Rhythm:Regular Rate:Normal     Neuro/Psych    GI/Hepatic   Endo/Other  Hyperthyroidism   Renal/GU      Musculoskeletal  (+) Arthritis ,   Abdominal (+) + obese,   Peds  Hematology   Anesthesia Other Findings   Reproductive/Obstetrics                            Anesthesia Physical Anesthesia Plan  ASA: II  Anesthesia Plan: General   Post-op Pain Management:  Regional for Post-op pain   Induction:   PONV Risk Score and Plan: 4 or greater and Ondansetron, Dexamethasone, Propofol, Midazolam, Scopolamine patch - Pre-op and Treatment may vary due to age  Airway Management Planned: Oral ETT and Video Laryngoscope Planned  Additional Equipment:   Intra-op Plan:   Post-operative Plan: Extubation in OR  Informed Consent: I have reviewed the patients History and Physical, chart, labs and discussed the procedure including the risks, benefits and alternatives for the proposed anesthesia with the patient or authorized representative who has indicated his/her understanding and acceptance.   Dental advisory given  Plan Discussed with: CRNA  Anesthesia Plan Comments:        Anesthesia Quick Evaluation

## 2017-04-04 NOTE — Anesthesia Procedure Notes (Signed)
Anesthesia Regional Block: Narrative:       

## 2017-04-04 NOTE — Anesthesia Procedure Notes (Signed)
Procedure Name: Intubation Date/Time: 04/04/2017 7:45 AM Performed by: Mariea Clonts Pre-anesthesia Checklist: Patient identified, Emergency Drugs available, Suction available and Patient being monitored Patient Re-evaluated:Patient Re-evaluated prior to inductionOxygen Delivery Method: Circle System Utilized Preoxygenation: Pre-oxygenation with 100% oxygen Intubation Type: IV induction Ventilation: Mask ventilation without difficulty Laryngoscope Size: Glidescope Grade View: Grade I Tube type: Oral Tube size: 7.0 mm Number of attempts: 1 Airway Equipment and Method: Stylet,  Oral airway and Video-laryngoscopy Placement Confirmation: ETT inserted through vocal cords under direct vision,  positive ETCO2 and breath sounds checked- equal and bilateral Tube secured with: Tape Dental Injury: Teeth and Oropharynx as per pre-operative assessment

## 2017-04-05 ENCOUNTER — Encounter (HOSPITAL_COMMUNITY): Payer: Self-pay | Admitting: Orthopedic Surgery

## 2017-04-05 MED ORDER — ONDANSETRON HCL 4 MG PO TABS: 4.0000 mg | ORAL_TABLET | Freq: Three times a day (TID) | ORAL | 0 refills | Status: DC | PRN

## 2017-04-05 MED ORDER — HYDROCODONE-ACETAMINOPHEN 10-325 MG PO TABS: 1.0000 | ORAL_TABLET | ORAL | 0 refills | Status: DC | PRN

## 2017-04-05 MED ORDER — DIAZEPAM 5 MG PO TABS: 2.5000 mg | ORAL_TABLET | Freq: Four times a day (QID) | ORAL | 1 refills | Status: DC | PRN

## 2017-04-05 NOTE — Evaluation (Signed)
Occupational Therapy Evaluation and Discharge Patient Details Name: Jacqueline [name] MRN: [id number] DOB: 10-Oct-1966 Today's Date: 04/05/2017    History of Present Illness  Right reverse shoulder arthroplasty    Clinical Impression   This 51 yo female admitted and underwent above presents to acute OT with all education complete and post op shoulder handout given. Further therapy as set up by surgeon.    Follow Up Recommendations  DC plan and follow up therapy as arranged by surgeon    Equipment Recommendations  Other (comment) (none)       Precautions / Restrictions Precautions Precautions: Shoulder Shoulder Interventions: Shoulder sling/immobilizer;Off for dressing/bathing/exercises Precaution Comments: If sitting in controlled environment, ok to come out of sling to give neck a break. Please sleep in it to protect until follow up in office. Active protocol (FF up to 90 degrees, abduction up to 60 degrees, no internal rotation, Required Braces or Orthoses: Sling Restrictions Weight Bearing Restrictions: Yes RUE Weight Bearing: Non weight bearing      Mobility Bed Mobility Overal bed mobility: Independent                Transfers Overall transfer level: Independent                        ADL either performed or assessed with clinical judgement         Vision Patient Visual Report: No change from baseline              Pertinent Vitals/Pain Pain Assessment: 0-10 Pain Score: 2  Pain Location: right shoulder Pain Descriptors / Indicators: Sore Pain Intervention(s): Limited activity within patient's tolerance;Monitored during session;Repositioned     Hand Dominance Right   Extremity/Trunk Assessment Upper Extremity Assessment Upper Extremity Assessment: RUE deficits/detail RUE Deficits / Details: shoulder sx this admission; elbow distally WNL RUE Coordination: decreased gross motor           Communication Communication Communication: No  difficulties   Cognition Arousal/Alertness: Awake/alert Behavior During Therapy: WFL for tasks assessed/performed Overall Cognitive Status: Within Functional Limits for tasks assessed                                        Exercises Other Exercises Other Exercises: Pt completed 10 reps each of shoulder FF/extension in standing and lap slides (she can get to about 20 degrees and is aware to not go more than 90 degrees); shoulder abduction (she was able to achieve ~20 degrees and is aware to not do more than 60 degrees); elbow flexion/extension, forearm supinatin/pronation; wrist flexion/extension, composite digit flexion/extension. She is aware to do these exercises 5x/day 10 reps each   Shoulder Instructions Shoulder Instructions Donning/doffing shirt without moving shoulder:  (verbalized understanding and repeated back to me the steps (no shirt here to try)) Method for sponge bathing under operated UE:  (returned demo simulating this) Donning/doffing sling/immobilizer: Modified independent Correct positioning of sling/immobilizer: Independent Pendulum exercises (written home exercise program):  (NA) ROM for elbow, wrist and digits of operated UE: Independent Sling wearing schedule (on at all times/off for ADL's):  (verbalized understanding. Also aware she can have sling strap off of neck if she is sitting and not moving around) Proper positioning of operated UE when showering:  (verbalized understanding) Dressing change:  (NA) Positioning of UE while sleeping:  (verbalized understanding)    Home Living Family/patient expects to be  discharged to:: Private residence Living Arrangements: Spouse/significant other Available Help at Discharge: Family;Available 24 hours/day Type of Home: House Home Access: Stairs to enter [employer] of Steps: 4 Entrance Stairs-Rails: Right Home Layout: One level     Bathroom Shower/Tub: Teacher, early years/pre:  Standard Bathroom Accessibility: Yes   Home Equipment: Environmental consultant - 2 wheels;Crutches;Bedside commode;Shower seat          Prior Functioning/Environment Level of Independence: Independent                 OT Problem List: Decreased range of motion;Impaired UE functional use;Pain         OT Goals(Current goals can be found in the care plan section) Acute Rehab OT Goals Patient Stated Goal: home today with husband   OT Frequency:                AM-PAC PT "6 Clicks" Daily Activity     Outcome Measure Help from another person eating meals?: A Little Help from another person taking care of personal grooming?: A Little Help from another person toileting, which includes using toliet, bedpan, or urinal?: A Little Help from another person bathing (including washing, rinsing, drying)?: A Little Help from another person to put on and taking off regular upper body clothing?: A Little Help from another person to put on and taking off regular lower body clothing?: A Little 6 Click Score: 18   End of Session Equipment Utilized During Treatment:  (sling) Nurse Communication:  (pt ready to go from therapy standpoint)  Activity Tolerance: Patient tolerated treatment well Patient left:  (up moving in room)  OT Visit Diagnosis: Pain Pain - Right/Left: Right Pain - part of body: Shoulder                Time: 8675-4492 OT Time Calculation (min): 35 min Charges:  OT General Charges $OT Visit: 1 Procedure OT Evaluation $OT Eval Moderate Complexity: 1 Procedure OT Treatments $Self Care/Home Management : 8-22 mins [place] [address], OTR/L [phone] 04/05/2017

## 2017-04-05 NOTE — Discharge Summary (Signed)
PATIENT ID:      Jacqueline [name]  MRN:     [id number] DOB/AGE:    1966/07/07 / 51 y.o.     DISCHARGE SUMMARY  ADMISSION DATE:    04/04/2017 DISCHARGE DATE:    ADMISSION DIAGNOSIS: Right shoulder rotator cuff tear arthropathy  Past Medical History:  Diagnosis Date  . Arthritis    rheumatoid  . Cancer ([place])    melanoma rem rt shoulder  . PONV (postoperative nausea and vomiting)    usually needs patch    DISCHARGE DIAGNOSIS:   Active Problems:   S/P reverse total shoulder arthroplasty, right   PROCEDURE: Procedure(s): REVERSE RIGHT SHOULDER ARTHROPLASTY on 04/04/2017  CONSULTS:    HISTORY:  See H&P in chart.  HOSPITAL COURSE:  Jacqueline [name] is a 51 y.o. admitted on 04/04/2017 with a diagnosis of Right shoulder rotator cuff tear arthropathy .  They were brought to the operating room on 04/04/2017 and underwent Procedure(s): REVERSE RIGHT SHOULDER ARTHROPLASTY.    They were given perioperative antibiotics: Anti-infectives    Start     Dose/Rate Route Frequency Ordered Stop   04/04/17 1300  ceFAZolin (ANCEF) IVPB 2g/100 mL premix     2 g 200 mL/hr over 30 Minutes Intravenous Every 6 hours 04/04/17 1105 04/05/17 0109   04/04/17 0700  ceFAZolin (ANCEF) IVPB 2g/100 mL premix     2 g 200 mL/hr over 30 Minutes Intravenous To [facility] 04/03/17 1126 04/04/17 0748    .  Patient underwent the above named procedure and tolerated it well. The following day they were hemodynamically stable and pain was controlled on oral analgesics. They were neurovascularly intact to the operative extremity. OT was ordered and worked with patient per protocol. They were medically and orthopaedically stable for discharge on day 1  .    DIAGNOSTIC STUDIES:  RECENT RADIOGRAPHIC STUDIES :  No results found.  RECENT VITAL SIGNS:  Patient Vitals for the past 24 hrs:  BP Temp Temp src Pulse Resp SpO2  04/05/17 0652 (!) 126/46 98.1 F (36.7 C) Oral 65 18 95 %  04/05/17 0119 117/65 98.4 F (36.9 C)  Oral 64 18 97 %  04/04/17 2049 (!) 127/59 98.7 F (37.1 C) Oral 76 20 98 %  04/04/17 1708 (!) 123/59 98.3 F (36.8 C) Oral 70 - 96 %  04/04/17 1100 137/63 98.3 F (36.8 C) Oral 71 (!) 22 94 %  04/04/17 1047 - 98.1 F (36.7 C) - - - -  04/04/17 1045 - - - 64 (!) 24 94 %  04/04/17 1034 138/73 - - 66 (!) 22 97 %  04/04/17 1033 (!) 128/93 - - 72 (!) 26 93 %  04/04/17 1030 - - - 82 (!) 27 96 %  04/04/17 1019 123/62 - - 66 (!) 21 100 %  04/04/17 1015 - - - 68 18 96 %  04/04/17 1005 111/61 - - 69 (!) 22 94 %  04/04/17 1000 - - - 71 18 100 %  04/04/17 0950 138/69 - - 73 (!) 22 100 %  04/04/17 0945 - - - 77 18 100 %  04/04/17 0935 138/70 97.9 F (36.6 C) - 88 (!) 22 100 %  04/04/17 0934 - 97.9 F (36.6 C) - - - -  .  RECENT EKG RESULTS:    Orders placed or performed during the hospital encounter of 03/27/17  . EKG 12 lead  . EKG 12 lead    DISCHARGE INSTRUCTIONS:    DISCHARGE MEDICATIONS:  Allergies as of 04/05/2017      Reactions   Codeine Nausea And Vomiting   Other    general anesthesia - nausea and vomiting    Oxycodone Itching      Medication List    TAKE these medications   atorvastatin 40 MG tablet Commonly known as:  LIPITOR Take 40 mg by mouth at bedtime.   diazepam 5 MG tablet Commonly known as:  VALIUM Take 0.5-1 tablets (2.5-5 mg total) by mouth every 6 (six) hours as needed for muscle spasms or sedation.   folic acid 878 MCG tablet Commonly known as:  FOLVITE Take 800 mcg by mouth daily.   HYDROcodone-acetaminophen 10-325 MG tablet Commonly known as:  NORCO Take 1-2 tablets by mouth every 4 (four) hours as needed (breakthrough pain). What changed:  how much to take  when to take this  reasons to take this   meloxicam 7.5 MG tablet Commonly known as:  MOBIC Take 7.5 mg by mouth daily.   methotrexate 2.5 MG tablet Commonly known as:  RHEUMATREX Take 15 mg by mouth every Friday. Caution:Chemotherapy. Protect from light.   multivitamin with  minerals Tabs tablet Take 1 tablet by mouth daily.   ondansetron 4 MG tablet Commonly known as:  ZOFRAN Take 1 tablet (4 mg total) by mouth every 8 (eight) hours as needed for nausea or vomiting.   predniSONE 5 MG tablet Commonly known as:  DELTASONE Take 5 mg by mouth daily with breakfast.       FOLLOW UP VISIT:   Follow-up Information    Justice Britain, MD.   Specialty:  Orthopedic Surgery Why:  call to be seen in 10-14 days Contact information: [address] [place] [address] [phone]           DISCHARGE TO: Home  DISPOSITION: Good  DISCHARGE CONDITION:  Festus Barren for Dr. Justice Britain 04/05/2017, 8:20 AM

## 2017-04-15 DIAGNOSIS — Z471 Aftercare following joint replacement surgery: Secondary | ICD-10-CM | POA: Diagnosis not present

## 2017-04-15 DIAGNOSIS — Z96611 Presence of right artificial shoulder joint: Secondary | ICD-10-CM | POA: Diagnosis not present

## 2017-04-16 DIAGNOSIS — M25511 Pain in right shoulder: Secondary | ICD-10-CM | POA: Diagnosis not present

## 2017-04-16 DIAGNOSIS — M62521 Muscle wasting and atrophy, not elsewhere classified, right upper arm: Secondary | ICD-10-CM | POA: Diagnosis not present

## 2017-04-16 DIAGNOSIS — Z96611 Presence of right artificial shoulder joint: Secondary | ICD-10-CM | POA: Diagnosis not present

## 2017-04-23 DIAGNOSIS — M62521 Muscle wasting and atrophy, not elsewhere classified, right upper arm: Secondary | ICD-10-CM | POA: Diagnosis not present

## 2017-04-23 DIAGNOSIS — M25511 Pain in right shoulder: Secondary | ICD-10-CM | POA: Diagnosis not present

## 2017-04-23 DIAGNOSIS — Z96611 Presence of right artificial shoulder joint: Secondary | ICD-10-CM | POA: Diagnosis not present

## 2017-04-30 DIAGNOSIS — Z96611 Presence of right artificial shoulder joint: Secondary | ICD-10-CM | POA: Diagnosis not present

## 2017-04-30 DIAGNOSIS — M62521 Muscle wasting and atrophy, not elsewhere classified, right upper arm: Secondary | ICD-10-CM | POA: Diagnosis not present

## 2017-04-30 DIAGNOSIS — M25511 Pain in right shoulder: Secondary | ICD-10-CM | POA: Diagnosis not present

## 2017-05-09 DIAGNOSIS — M62521 Muscle wasting and atrophy, not elsewhere classified, right upper arm: Secondary | ICD-10-CM | POA: Diagnosis not present

## 2017-05-09 DIAGNOSIS — M25511 Pain in right shoulder: Secondary | ICD-10-CM | POA: Diagnosis not present

## 2017-05-09 DIAGNOSIS — Z96611 Presence of right artificial shoulder joint: Secondary | ICD-10-CM | POA: Diagnosis not present

## 2017-05-11 DIAGNOSIS — M1993 Secondary osteoarthritis, unspecified site: Secondary | ICD-10-CM | POA: Diagnosis not present

## 2017-05-11 DIAGNOSIS — M19011 Primary osteoarthritis, right shoulder: Secondary | ICD-10-CM | POA: Diagnosis not present

## 2017-05-11 DIAGNOSIS — M05741 Rheumatoid arthritis with rheumatoid factor of right hand without organ or systems involvement: Secondary | ICD-10-CM | POA: Diagnosis not present

## 2017-05-11 DIAGNOSIS — E785 Hyperlipidemia, unspecified: Secondary | ICD-10-CM | POA: Diagnosis not present

## 2017-05-13 DIAGNOSIS — Z471 Aftercare following joint replacement surgery: Secondary | ICD-10-CM | POA: Diagnosis not present

## 2017-05-13 DIAGNOSIS — Z96611 Presence of right artificial shoulder joint: Secondary | ICD-10-CM | POA: Diagnosis not present

## 2017-05-21 DIAGNOSIS — Z96611 Presence of right artificial shoulder joint: Secondary | ICD-10-CM | POA: Diagnosis not present

## 2017-05-21 DIAGNOSIS — M62521 Muscle wasting and atrophy, not elsewhere classified, right upper arm: Secondary | ICD-10-CM | POA: Diagnosis not present

## 2017-05-21 DIAGNOSIS — M25511 Pain in right shoulder: Secondary | ICD-10-CM | POA: Diagnosis not present

## 2017-05-30 DIAGNOSIS — M25511 Pain in right shoulder: Secondary | ICD-10-CM | POA: Diagnosis not present

## 2017-05-30 DIAGNOSIS — M62521 Muscle wasting and atrophy, not elsewhere classified, right upper arm: Secondary | ICD-10-CM | POA: Diagnosis not present

## 2017-05-30 DIAGNOSIS — Z96611 Presence of right artificial shoulder joint: Secondary | ICD-10-CM | POA: Diagnosis not present

## 2017-08-03 DIAGNOSIS — E785 Hyperlipidemia, unspecified: Secondary | ICD-10-CM | POA: Diagnosis not present

## 2017-08-03 DIAGNOSIS — M1993 Secondary osteoarthritis, unspecified site: Secondary | ICD-10-CM | POA: Diagnosis not present

## 2017-08-03 DIAGNOSIS — M05741 Rheumatoid arthritis with rheumatoid factor of right hand without organ or systems involvement: Secondary | ICD-10-CM | POA: Diagnosis not present

## 2017-09-24 DIAGNOSIS — R531 Weakness: Secondary | ICD-10-CM | POA: Diagnosis not present

## 2017-09-24 DIAGNOSIS — L400 Psoriasis vulgaris: Secondary | ICD-10-CM | POA: Diagnosis not present

## 2017-09-24 DIAGNOSIS — L4050 Arthropathic psoriasis, unspecified: Secondary | ICD-10-CM | POA: Diagnosis not present

## 2017-09-24 DIAGNOSIS — D225 Melanocytic nevi of trunk: Secondary | ICD-10-CM | POA: Diagnosis not present

## 2017-11-08 DIAGNOSIS — Z6836 Body mass index (BMI) 36.0-36.9, adult: Secondary | ICD-10-CM | POA: Diagnosis not present

## 2017-11-08 DIAGNOSIS — Z1331 Encounter for screening for depression: Secondary | ICD-10-CM | POA: Diagnosis not present

## 2017-11-08 DIAGNOSIS — Z23 Encounter for immunization: Secondary | ICD-10-CM | POA: Diagnosis not present

## 2017-11-08 DIAGNOSIS — M1993 Secondary osteoarthritis, unspecified site: Secondary | ICD-10-CM | POA: Diagnosis not present

## 2017-11-08 DIAGNOSIS — M05741 Rheumatoid arthritis with rheumatoid factor of right hand without organ or systems involvement: Secondary | ICD-10-CM | POA: Diagnosis not present

## 2017-11-08 DIAGNOSIS — E785 Hyperlipidemia, unspecified: Secondary | ICD-10-CM | POA: Diagnosis not present

## 2017-11-26 DIAGNOSIS — Z85820 Personal history of malignant melanoma of skin: Secondary | ICD-10-CM | POA: Diagnosis not present

## 2017-11-26 DIAGNOSIS — L4050 Arthropathic psoriasis, unspecified: Secondary | ICD-10-CM | POA: Diagnosis not present

## 2017-11-26 DIAGNOSIS — L400 Psoriasis vulgaris: Secondary | ICD-10-CM | POA: Diagnosis not present

## 2017-12-27 DIAGNOSIS — H5203 Hypermetropia, bilateral: Secondary | ICD-10-CM | POA: Diagnosis not present

## 2018-02-07 DIAGNOSIS — M05741 Rheumatoid arthritis with rheumatoid factor of right hand without organ or systems involvement: Secondary | ICD-10-CM | POA: Diagnosis not present

## 2018-02-07 DIAGNOSIS — E785 Hyperlipidemia, unspecified: Secondary | ICD-10-CM | POA: Diagnosis not present

## 2018-02-07 DIAGNOSIS — Z6835 Body mass index (BMI) 35.0-35.9, adult: Secondary | ICD-10-CM | POA: Diagnosis not present

## 2018-02-07 DIAGNOSIS — M8589 Other specified disorders of bone density and structure, multiple sites: Secondary | ICD-10-CM | POA: Diagnosis not present

## 2018-02-07 DIAGNOSIS — Z1231 Encounter for screening mammogram for malignant neoplasm of breast: Secondary | ICD-10-CM | POA: Diagnosis not present

## 2018-02-07 DIAGNOSIS — Z1211 Encounter for screening for malignant neoplasm of colon: Secondary | ICD-10-CM | POA: Diagnosis not present

## 2018-02-07 DIAGNOSIS — M1993 Secondary osteoarthritis, unspecified site: Secondary | ICD-10-CM | POA: Diagnosis not present

## 2018-03-07 DIAGNOSIS — Z13820 Encounter for screening for osteoporosis: Secondary | ICD-10-CM | POA: Diagnosis not present

## 2018-03-07 DIAGNOSIS — Z1231 Encounter for screening mammogram for malignant neoplasm of breast: Secondary | ICD-10-CM | POA: Diagnosis not present

## 2018-03-07 DIAGNOSIS — M8589 Other specified disorders of bone density and structure, multiple sites: Secondary | ICD-10-CM | POA: Diagnosis not present

## 2018-03-10 DIAGNOSIS — Z01818 Encounter for other preprocedural examination: Secondary | ICD-10-CM | POA: Diagnosis not present

## 2018-04-01 DIAGNOSIS — Z1211 Encounter for screening for malignant neoplasm of colon: Secondary | ICD-10-CM | POA: Diagnosis not present

## 2018-04-01 DIAGNOSIS — Z85828 Personal history of other malignant neoplasm of skin: Secondary | ICD-10-CM | POA: Diagnosis not present

## 2018-04-01 DIAGNOSIS — D126 Benign neoplasm of colon, unspecified: Secondary | ICD-10-CM | POA: Diagnosis not present

## 2018-04-01 DIAGNOSIS — Z7951 Long term (current) use of inhaled steroids: Secondary | ICD-10-CM | POA: Diagnosis not present

## 2018-04-01 DIAGNOSIS — Z79899 Other long term (current) drug therapy: Secondary | ICD-10-CM | POA: Diagnosis not present

## 2018-04-01 DIAGNOSIS — E785 Hyperlipidemia, unspecified: Secondary | ICD-10-CM | POA: Diagnosis not present

## 2018-04-01 DIAGNOSIS — D124 Benign neoplasm of descending colon: Secondary | ICD-10-CM | POA: Diagnosis not present

## 2018-04-01 DIAGNOSIS — D125 Benign neoplasm of sigmoid colon: Secondary | ICD-10-CM | POA: Diagnosis not present

## 2018-04-01 DIAGNOSIS — M069 Rheumatoid arthritis, unspecified: Secondary | ICD-10-CM | POA: Diagnosis not present

## 2018-04-01 DIAGNOSIS — D122 Benign neoplasm of ascending colon: Secondary | ICD-10-CM | POA: Diagnosis not present

## 2018-04-01 DIAGNOSIS — Z7952 Long term (current) use of systemic steroids: Secondary | ICD-10-CM | POA: Diagnosis not present

## 2018-05-09 DIAGNOSIS — M05741 Rheumatoid arthritis with rheumatoid factor of right hand without organ or systems involvement: Secondary | ICD-10-CM | POA: Diagnosis not present

## 2018-05-09 DIAGNOSIS — E785 Hyperlipidemia, unspecified: Secondary | ICD-10-CM | POA: Diagnosis not present

## 2018-05-09 DIAGNOSIS — Z6835 Body mass index (BMI) 35.0-35.9, adult: Secondary | ICD-10-CM | POA: Diagnosis not present

## 2018-05-09 DIAGNOSIS — Z1339 Encounter for screening examination for other mental health and behavioral disorders: Secondary | ICD-10-CM | POA: Diagnosis not present

## 2018-05-09 DIAGNOSIS — M1993 Secondary osteoarthritis, unspecified site: Secondary | ICD-10-CM | POA: Diagnosis not present

## 2018-07-29 DIAGNOSIS — L400 Psoriasis vulgaris: Secondary | ICD-10-CM | POA: Diagnosis not present

## 2018-07-29 DIAGNOSIS — D225 Melanocytic nevi of trunk: Secondary | ICD-10-CM | POA: Diagnosis not present

## 2018-07-29 DIAGNOSIS — L4050 Arthropathic psoriasis, unspecified: Secondary | ICD-10-CM | POA: Diagnosis not present

## 2018-07-29 DIAGNOSIS — L300 Nummular dermatitis: Secondary | ICD-10-CM | POA: Diagnosis not present

## 2018-08-15 DIAGNOSIS — Z23 Encounter for immunization: Secondary | ICD-10-CM | POA: Diagnosis not present

## 2018-08-15 DIAGNOSIS — E785 Hyperlipidemia, unspecified: Secondary | ICD-10-CM | POA: Diagnosis not present

## 2018-08-15 DIAGNOSIS — M05741 Rheumatoid arthritis with rheumatoid factor of right hand without organ or systems involvement: Secondary | ICD-10-CM | POA: Diagnosis not present

## 2018-08-15 DIAGNOSIS — M1993 Secondary osteoarthritis, unspecified site: Secondary | ICD-10-CM | POA: Diagnosis not present

## 2018-11-21 DIAGNOSIS — M05741 Rheumatoid arthritis with rheumatoid factor of right hand without organ or systems involvement: Secondary | ICD-10-CM | POA: Diagnosis not present

## 2018-11-21 DIAGNOSIS — Z1331 Encounter for screening for depression: Secondary | ICD-10-CM | POA: Diagnosis not present

## 2018-11-21 DIAGNOSIS — E785 Hyperlipidemia, unspecified: Secondary | ICD-10-CM | POA: Diagnosis not present

## 2018-11-21 DIAGNOSIS — M1993 Secondary osteoarthritis, unspecified site: Secondary | ICD-10-CM | POA: Diagnosis not present

## 2018-12-02 DIAGNOSIS — M67472 Ganglion, left ankle and foot: Secondary | ICD-10-CM | POA: Diagnosis not present

## 2018-12-02 DIAGNOSIS — S90415A Abrasion, left lesser toe(s), initial encounter: Secondary | ICD-10-CM | POA: Diagnosis not present

## 2019-01-01 DIAGNOSIS — M67472 Ganglion, left ankle and foot: Secondary | ICD-10-CM | POA: Diagnosis not present

## 2019-02-27 DIAGNOSIS — Z1231 Encounter for screening mammogram for malignant neoplasm of breast: Secondary | ICD-10-CM | POA: Diagnosis not present

## 2019-02-27 DIAGNOSIS — M05741 Rheumatoid arthritis with rheumatoid factor of right hand without organ or systems involvement: Secondary | ICD-10-CM | POA: Diagnosis not present

## 2019-02-27 DIAGNOSIS — E785 Hyperlipidemia, unspecified: Secondary | ICD-10-CM | POA: Diagnosis not present

## 2019-02-27 DIAGNOSIS — M1993 Secondary osteoarthritis, unspecified site: Secondary | ICD-10-CM | POA: Diagnosis not present

## 2019-02-27 DIAGNOSIS — E669 Obesity, unspecified: Secondary | ICD-10-CM | POA: Diagnosis not present

## 2019-03-19 DIAGNOSIS — M05741 Rheumatoid arthritis with rheumatoid factor of right hand without organ or systems involvement: Secondary | ICD-10-CM | POA: Diagnosis not present

## 2019-03-19 DIAGNOSIS — M1993 Secondary osteoarthritis, unspecified site: Secondary | ICD-10-CM | POA: Diagnosis not present

## 2019-03-31 DIAGNOSIS — R531 Weakness: Secondary | ICD-10-CM | POA: Diagnosis not present

## 2019-03-31 DIAGNOSIS — L400 Psoriasis vulgaris: Secondary | ICD-10-CM | POA: Diagnosis not present

## 2019-03-31 DIAGNOSIS — E785 Hyperlipidemia, unspecified: Secondary | ICD-10-CM | POA: Diagnosis not present

## 2019-05-14 DIAGNOSIS — L400 Psoriasis vulgaris: Secondary | ICD-10-CM | POA: Diagnosis not present

## 2019-05-14 DIAGNOSIS — D225 Melanocytic nevi of trunk: Secondary | ICD-10-CM | POA: Diagnosis not present

## 2019-05-14 DIAGNOSIS — L4050 Arthropathic psoriasis, unspecified: Secondary | ICD-10-CM | POA: Diagnosis not present

## 2019-05-14 DIAGNOSIS — Z85820 Personal history of malignant melanoma of skin: Secondary | ICD-10-CM | POA: Diagnosis not present

## 2019-05-28 DIAGNOSIS — Z1231 Encounter for screening mammogram for malignant neoplasm of breast: Secondary | ICD-10-CM | POA: Diagnosis not present

## 2019-06-05 DIAGNOSIS — M05741 Rheumatoid arthritis with rheumatoid factor of right hand without organ or systems involvement: Secondary | ICD-10-CM | POA: Diagnosis not present

## 2019-06-05 DIAGNOSIS — M1993 Secondary osteoarthritis, unspecified site: Secondary | ICD-10-CM | POA: Diagnosis not present

## 2019-06-05 DIAGNOSIS — M05742 Rheumatoid arthritis with rheumatoid factor of left hand without organ or systems involvement: Secondary | ICD-10-CM | POA: Diagnosis not present

## 2019-06-05 DIAGNOSIS — E785 Hyperlipidemia, unspecified: Secondary | ICD-10-CM | POA: Diagnosis not present

## 2019-06-05 DIAGNOSIS — Z1331 Encounter for screening for depression: Secondary | ICD-10-CM | POA: Diagnosis not present

## 2019-09-11 DIAGNOSIS — M1993 Secondary osteoarthritis, unspecified site: Secondary | ICD-10-CM | POA: Diagnosis not present

## 2019-09-11 DIAGNOSIS — Z6835 Body mass index (BMI) 35.0-35.9, adult: Secondary | ICD-10-CM | POA: Diagnosis not present

## 2019-09-11 DIAGNOSIS — E785 Hyperlipidemia, unspecified: Secondary | ICD-10-CM | POA: Diagnosis not present

## 2019-09-11 DIAGNOSIS — M05742 Rheumatoid arthritis with rheumatoid factor of left hand without organ or systems involvement: Secondary | ICD-10-CM | POA: Diagnosis not present

## 2019-09-11 DIAGNOSIS — Z23 Encounter for immunization: Secondary | ICD-10-CM | POA: Diagnosis not present

## 2019-09-11 DIAGNOSIS — M05741 Rheumatoid arthritis with rheumatoid factor of right hand without organ or systems involvement: Secondary | ICD-10-CM | POA: Diagnosis not present

## 2019-09-17 DIAGNOSIS — M67472 Ganglion, left ankle and foot: Secondary | ICD-10-CM | POA: Diagnosis not present

## 2019-11-23 ENCOUNTER — Other Ambulatory Visit: Payer: Self-pay

## 2019-11-23 ENCOUNTER — Ambulatory Visit (INDEPENDENT_AMBULATORY_CARE_PROVIDER_SITE_OTHER): Payer: PPO | Admitting: Podiatry

## 2019-11-23 ENCOUNTER — Other Ambulatory Visit: Payer: Self-pay | Admitting: Podiatry

## 2019-11-23 DIAGNOSIS — L02612 Cutaneous abscess of left foot: Secondary | ICD-10-CM | POA: Diagnosis not present

## 2019-11-23 DIAGNOSIS — L02619 Cutaneous abscess of unspecified foot: Secondary | ICD-10-CM | POA: Diagnosis not present

## 2019-11-23 DIAGNOSIS — L729 Follicular cyst of the skin and subcutaneous tissue, unspecified: Secondary | ICD-10-CM | POA: Diagnosis not present

## 2019-11-23 DIAGNOSIS — L03119 Cellulitis of unspecified part of limb: Secondary | ICD-10-CM

## 2019-11-23 DIAGNOSIS — L089 Local infection of the skin and subcutaneous tissue, unspecified: Secondary | ICD-10-CM | POA: Diagnosis not present

## 2019-11-23 DIAGNOSIS — G8929 Other chronic pain: Secondary | ICD-10-CM

## 2019-11-23 DIAGNOSIS — M79672 Pain in left foot: Secondary | ICD-10-CM

## 2019-11-23 DIAGNOSIS — L905 Scar conditions and fibrosis of skin: Secondary | ICD-10-CM | POA: Diagnosis not present

## 2019-11-23 DIAGNOSIS — M67472 Ganglion, left ankle and foot: Secondary | ICD-10-CM

## 2019-11-23 MED ORDER — DOXYCYCLINE HYCLATE 100 MG PO TABS: 100.0000 mg | ORAL_TABLET | Freq: Two times a day (BID) | ORAL | 0 refills | Status: DC

## 2019-11-23 NOTE — Progress Notes (Signed)
  Subjective:  Patient ID: GEMA ACOMB, female    DOB: 1965-11-05,  MRN: [id number]  Chief Complaint  Patient presents with  . Cyst    Lt foot medial side below medial ankle leison x 1 year; 8/10 sharp constatn pain -pt denies injury -w/ slight redness and draiange -wrose with major activities Tx: none -pt states she has tried to pop it   54 y.o. female presents with the above complaint. History confirmed with patient.   Objective:  Physical Exam: warm, good capillary refill, no trophic changes or ulcerative lesions, normal DP and PT pulses and normal sensory exam. Left Foot: fluctuant lesion with draining sinus with serous drainage left medial ankle. Surrounding induration, pain to palpation    Assessment:   1. Cellulitis and abscess of foot, except toes   2. Infected cyst of skin   3. Pain, foot, left, chronic     Plan:  Patient was evaluated and treated and all questions answered.  Abscess Left Foot -Incision and drainage performed as below. Lesion head sent for pathology -Given findings today this appears more to be a small infected abscess than a ganglion cyst. Await pathology. -Discussed she will likely need full excision of the cyst if abx fail to alleviate it. -Rx doxycycline. -Advised to cleanse the area daily, apply abx ointment and compression bandage. Discussed risk of bleeding. -F/u in 2 weeks for recheck. -XR at next visit  Procedure: Incision and drainage of abscess Anesthesia: lidocaine 1% with epinephrine Instrumentation: 15 blade, 69mm punch biopsy Technique: Following anesthesia and sterile skin prep with betadine, the lesion head was excised with a 20mm punch biopsy tool and collected for pathology. The area was explored and maximally expressed. No thick fluid was expressed. The area was cleansed and sterile dressing was applied. Dressing: Dry, sterile, compression dressing. Disposition: Patient tolerated procedure well. Patient to return in 2 weeks for  follow-up.

## 2019-11-23 NOTE — Addendum Note (Signed)
Addended by: Allean Found on: 11/23/2019 01:20 PM   Modules accepted: Orders

## 2019-12-07 ENCOUNTER — Other Ambulatory Visit: Payer: Self-pay

## 2019-12-07 ENCOUNTER — Other Ambulatory Visit: Payer: Self-pay | Admitting: Podiatry

## 2019-12-07 ENCOUNTER — Ambulatory Visit (INDEPENDENT_AMBULATORY_CARE_PROVIDER_SITE_OTHER): Payer: PPO

## 2019-12-07 ENCOUNTER — Telehealth: Payer: Self-pay | Admitting: Podiatry

## 2019-12-07 ENCOUNTER — Telehealth: Payer: Self-pay | Admitting: *Deleted

## 2019-12-07 ENCOUNTER — Ambulatory Visit: Payer: PPO | Admitting: Podiatry

## 2019-12-07 DIAGNOSIS — L089 Local infection of the skin and subcutaneous tissue, unspecified: Secondary | ICD-10-CM

## 2019-12-07 DIAGNOSIS — L03119 Cellulitis of unspecified part of limb: Secondary | ICD-10-CM

## 2019-12-07 DIAGNOSIS — Z7952 Long term (current) use of systemic steroids: Secondary | ICD-10-CM | POA: Diagnosis not present

## 2019-12-07 DIAGNOSIS — M069 Rheumatoid arthritis, unspecified: Secondary | ICD-10-CM

## 2019-12-07 DIAGNOSIS — L03116 Cellulitis of left lower limb: Secondary | ICD-10-CM | POA: Diagnosis not present

## 2019-12-07 DIAGNOSIS — L02619 Cutaneous abscess of unspecified foot: Secondary | ICD-10-CM

## 2019-12-07 DIAGNOSIS — L729 Follicular cyst of the skin and subcutaneous tissue, unspecified: Secondary | ICD-10-CM | POA: Diagnosis not present

## 2019-12-07 DIAGNOSIS — M79672 Pain in left foot: Secondary | ICD-10-CM

## 2019-12-07 MED ORDER — DOXYCYCLINE HYCLATE 100 MG PO TABS: 100.0000 mg | ORAL_TABLET | Freq: Two times a day (BID) | ORAL | 0 refills | Status: AC

## 2019-12-07 NOTE — Progress Notes (Signed)
  Subjective:  Patient ID: Jacqueline [name], female    DOB: [birth year],  MRN: [id number]  Chief Complaint  Patient presents with  . Abscess    F?U Lt absces Pt. states," it looks and feel a lot better; 4/10 constatn tenderness -w/ less redness and swellgin Tx: red oil, neosporin and bandaid -w;/ clear drianage   54 y.o. female presents with the above complaint. History confirmed with patient. States the area is doing better, completed her antibiotic but redness has come back.  Objective:  Physical Exam: warm, good capillary refill, no trophic changes or ulcerative lesions, normal DP and PT pulses and normal sensory exam. Left Foot: fluctuant mass left ankle without active drainage    No images are attached to the encounter.  Radiographs: X-ray of the left foot: no osseous cause for cyst, s/p multiple surgical procedures with intact fixation and bridged arthrodesis sites   Assessment:   1. Cellulitis and abscess of foot, except toes   2. Infected cyst of skin   3. Rheumatoid arthritis involving multiple sites, unspecified whether rheumatoid factor present ([place])   4. Current chronic use of systemic steroids    Plan:  Patient was evaluated and treated and all questions answered.  Cyst/Abscess left foot; RA - chronic stable -Pathology reviewed - follicular cyst/abscess -Area not currently draining - nothing to culture. Refll Doxycyline -Would benefit from major surgery - cyst excision and drainage. Will plan for surgery next week -Consent forms reviewed and drafted -States she has a CAM boot at home -She does have risk factors being on chronic steroid use, RA  Return for post-op care.

## 2019-12-07 NOTE — Telephone Encounter (Signed)
"  February 17 will work fine.  You can just go ahead and put me down."

## 2019-12-07 NOTE — Telephone Encounter (Signed)
Left message for patient to confirm DOS 12/16/2019

## 2019-12-07 NOTE — Patient Instructions (Signed)
Pre-Operative Instructions  Congratulations, you have decided to take an important step towards improving your quality of life.  You can be assured that the doctors and staff at [facility] will be with you every step of the way.  Here are some important things you should know:  1. Plan to be at the surgery center/hospital at least 1 (one) hour prior to your scheduled time, unless otherwise directed by the surgical center/hospital staff.  You must have a responsible adult accompany you, remain during the surgery and drive you home.  Make sure you have directions to the surgical center/hospital to ensure you arrive on time. 2. If you are having surgery at [facility] or [facility] hospitals, you will need a copy of your medical history and physical form from your family physician within one month prior to the date of surgery. We will give you a form for your primary physician to complete.  3. We make every effort to accommodate the date you request for surgery.  However, there are times where surgery dates or times have to be moved.  We will contact you as soon as possible if a change in schedule is required.   4. No aspirin/ibuprofen for one week before surgery.  If you are on aspirin, any non-steroidal anti-inflammatory medications (Mobic, Aleve, Ibuprofen) should not be taken seven (7) days prior to your surgery.  You make take Tylenol for pain prior to surgery.  5. Medications - If you are taking daily heart and blood pressure medications, seizure, reflux, allergy, asthma, anxiety, pain or diabetes medications, make sure you notify the surgery center/hospital before the day of surgery so they can tell you which medications you should take or avoid the day of surgery. 6. No food or drink after midnight the night before surgery unless directed otherwise by surgical center/hospital staff. 7. No alcoholic beverages 24-hours prior to surgery.  No smoking 24-hours prior or 24-hours after  surgery. 8. Wear loose pants or shorts. They should be loose enough to fit over bandages, boots, and casts. 9. Don't wear slip-on shoes. Sneakers are preferred. 10. Bring your boot with you to the surgery center/hospital.  Also bring crutches or a walker if your physician has prescribed it for you.  If you do not have this equipment, it will be provided for you after surgery. 11. If you have not been contacted by the surgery center/hospital by the day before your surgery, call to confirm the date and time of your surgery. 12. Leave-time from work may vary depending on the type of surgery you have.  Appropriate arrangements should be made prior to surgery with your employer. 13. Prescriptions will be provided immediately following surgery by your doctor.  Fill these as soon as possible after surgery and take the medication as directed. Pain medications will not be refilled on weekends and must be approved by the doctor. 14. Remove nail polish on the operative foot and avoid getting pedicures prior to surgery. 15. Wash the night before surgery.  The night before surgery wash the foot and leg well with water and the antibacterial soap provided. Be sure to pay special attention to beneath the toenails and in between the toes.  Wash for at least three (3) minutes. Rinse thoroughly with water and dry well with a towel.  Perform this wash unless told not to do so by your physician.  Enclosed: 1 Ice pack (please put in freezer the night before surgery)   1 Hibiclens skin cleaner     Pre-op instructions  If you have any questions regarding the instructions, please do not hesitate to call our office.  [place]: [address], [place], [place] [address] -- [phone]  [place]: [address] [place], [place] [address] -- [phone]  [place]: [address], [place], [place] [address] -- [phone]   Website: [email or web address] 

## 2019-12-07 NOTE — Telephone Encounter (Signed)
I need to talk to you about scheduling surgery with Dr. March Rummage. Please give me a call back.

## 2019-12-08 ENCOUNTER — Telehealth: Payer: Self-pay | Admitting: *Deleted

## 2019-12-08 NOTE — Telephone Encounter (Signed)
"  I'm supposed to have surgery next Wednesday, February 17.  I just want to know what time I need to be there.  Nobody has told me what time."

## 2019-12-09 ENCOUNTER — Other Ambulatory Visit: Payer: Self-pay | Admitting: Podiatry

## 2019-12-09 DIAGNOSIS — L02619 Cutaneous abscess of unspecified foot: Secondary | ICD-10-CM

## 2019-12-09 NOTE — Telephone Encounter (Signed)
Spoke to patient, told her the surgery center will call her the day before surgery to let her know what time to be there.

## 2019-12-11 ENCOUNTER — Telehealth: Payer: Self-pay

## 2019-12-11 NOTE — Telephone Encounter (Addendum)
DOS 12/16/2019 EXC. GANGLION/TUMOR - [id number]  [facility] - EFFECTIVE 10/30/2019  FAXED CLINICALS FOR AUTHORIZATION [id number]  SPOKE TO FELICIA AT [facility] - [id number] WAS APPROVED AUTH# [id number].

## 2019-12-16 ENCOUNTER — Other Ambulatory Visit: Payer: Self-pay | Admitting: Podiatry

## 2019-12-16 DIAGNOSIS — Z85820 Personal history of malignant melanoma of skin: Secondary | ICD-10-CM | POA: Diagnosis not present

## 2019-12-16 DIAGNOSIS — D492 Neoplasm of unspecified behavior of bone, soft tissue, and skin: Secondary | ICD-10-CM | POA: Diagnosis not present

## 2019-12-16 DIAGNOSIS — M67472 Ganglion, left ankle and foot: Secondary | ICD-10-CM | POA: Diagnosis not present

## 2019-12-16 DIAGNOSIS — L723 Sebaceous cyst: Secondary | ICD-10-CM | POA: Diagnosis not present

## 2019-12-16 DIAGNOSIS — L929 Granulomatous disorder of the skin and subcutaneous tissue, unspecified: Secondary | ICD-10-CM | POA: Diagnosis not present

## 2019-12-16 DIAGNOSIS — L03119 Cellulitis of unspecified part of limb: Secondary | ICD-10-CM | POA: Diagnosis not present

## 2019-12-16 DIAGNOSIS — M7989 Other specified soft tissue disorders: Secondary | ICD-10-CM | POA: Diagnosis not present

## 2019-12-16 MED ORDER — HYDROCODONE-ACETAMINOPHEN 10-325 MG PO TABS: 1.0000 | ORAL_TABLET | ORAL | 0 refills | Status: DC | PRN

## 2019-12-16 MED ORDER — ONDANSETRON HCL 4 MG PO TABS: 4.0000 mg | ORAL_TABLET | Freq: Three times a day (TID) | ORAL | 0 refills | Status: AC | PRN

## 2019-12-16 MED ORDER — CEPHALEXIN 500 MG PO CAPS: 500.0000 mg | ORAL_CAPSULE | Freq: Two times a day (BID) | ORAL | 0 refills | Status: AC

## 2019-12-16 NOTE — Progress Notes (Signed)
Rx sent to pharmacy for outpatient surgery. °

## 2019-12-17 ENCOUNTER — Telehealth: Payer: Self-pay | Admitting: Podiatry

## 2019-12-17 NOTE — Telephone Encounter (Signed)
Called and spoke to patient for post-op check. Reports pain but is taking a half tablet of pain medicine every 3 hours. Denies other issues. Discussed increasing her pain medicine to percocet should she like but she wishes to hold off at this time. Patient to call if she would like to try it.

## 2019-12-18 DIAGNOSIS — M05741 Rheumatoid arthritis with rheumatoid factor of right hand without organ or systems involvement: Secondary | ICD-10-CM | POA: Diagnosis not present

## 2019-12-18 DIAGNOSIS — E785 Hyperlipidemia, unspecified: Secondary | ICD-10-CM | POA: Diagnosis not present

## 2019-12-18 DIAGNOSIS — M05742 Rheumatoid arthritis with rheumatoid factor of left hand without organ or systems involvement: Secondary | ICD-10-CM | POA: Diagnosis not present

## 2019-12-18 DIAGNOSIS — Z6836 Body mass index (BMI) 36.0-36.9, adult: Secondary | ICD-10-CM | POA: Diagnosis not present

## 2019-12-18 DIAGNOSIS — M1993 Secondary osteoarthritis, unspecified site: Secondary | ICD-10-CM | POA: Diagnosis not present

## 2019-12-22 ENCOUNTER — Other Ambulatory Visit: Payer: Self-pay

## 2019-12-22 ENCOUNTER — Ambulatory Visit (INDEPENDENT_AMBULATORY_CARE_PROVIDER_SITE_OTHER): Payer: PPO | Admitting: Podiatry

## 2019-12-22 DIAGNOSIS — Z9889 Other specified postprocedural states: Secondary | ICD-10-CM

## 2019-12-22 MED ORDER — HYDROCODONE-ACETAMINOPHEN 10-325 MG PO TABS: 1.0000 | ORAL_TABLET | ORAL | 0 refills | Status: DC | PRN

## 2019-12-22 NOTE — Progress Notes (Signed)
  Subjective:  Patient ID: Jacqueline [name], female    DOB: [birth year],  MRN: [id number]  Chief Complaint  Patient presents with  . Routine Post Op    POV#1 Pt. states," doing good, pain is not too bad; 3-4/10." tx: hydrocodone and boot and icing and ele vation    DOS: 12/16/19 Procedure: Excision of skin lesion, excision of ganglion left foot   54 y.o. female presents with the above complaint. History confirmed with patient.   Objective:  Physical Exam: tenderness at the surgical site, local edema noted and calf supple, nontender. Incision: healing well, no significant drainage, no dehiscence, no significant erythema  Assessment:   1. Post-operative state     Plan:  Patient was evaluated and treated and all questions answered.  Post-operative State -Dressing applied consisting of povidone, sterile gauze, kerlix and Coban -WBAT in CAM boot -Pain medication refilled -Path pending  Return in about 1 week (around 12/29/2019).

## 2019-12-24 ENCOUNTER — Encounter: Payer: PPO | Admitting: Podiatry

## 2019-12-28 ENCOUNTER — Other Ambulatory Visit: Payer: Self-pay

## 2019-12-28 ENCOUNTER — Ambulatory Visit (INDEPENDENT_AMBULATORY_CARE_PROVIDER_SITE_OTHER): Payer: PPO | Admitting: Podiatry

## 2019-12-28 DIAGNOSIS — Z9889 Other specified postprocedural states: Secondary | ICD-10-CM | POA: Diagnosis not present

## 2019-12-28 DIAGNOSIS — L089 Local infection of the skin and subcutaneous tissue, unspecified: Secondary | ICD-10-CM | POA: Diagnosis not present

## 2019-12-28 DIAGNOSIS — L729 Follicular cyst of the skin and subcutaneous tissue, unspecified: Secondary | ICD-10-CM

## 2019-12-28 NOTE — Progress Notes (Signed)
  Subjective:  Patient ID: WITTEN REGISTER, female    DOB: 04-Jan-1966,  MRN: [id number]  No chief complaint on file.  DOS: 12/16/19 Procedure: Excision of skin lesion, excision of ganglion left foot   54 y.o. female presents with the above complaint. History confirmed with patient.   Objective:  Physical Exam: tenderness at the surgical site, local edema noted and calf supple, nontender. Incision: healing well, no significant drainage, no dehiscence, no significant erythema  Assessment:   1. Post-operative state   2. Infected cyst of skin     Plan:  Patient was evaluated and treated and all questions answered.  Post-operative State -Every other suture removed. Abx ointment and bandage reapplied -Surgical shoe dispensed. F/u in 1 week for recheck. -Reviewed path with patient again - previously did over the phone - cholesterol granules in inflamed cyst.  No follow-ups on file.

## 2019-12-31 ENCOUNTER — Encounter: Payer: PPO | Admitting: Podiatry

## 2020-01-01 ENCOUNTER — Telehealth: Payer: Self-pay

## 2020-01-01 NOTE — Telephone Encounter (Signed)
Pt called requesting a RF on hydrocodone

## 2020-01-03 MED ORDER — HYDROCODONE-ACETAMINOPHEN 10-325 MG PO TABS: 1.0000 | ORAL_TABLET | ORAL | 0 refills | Status: AC | PRN

## 2020-01-03 NOTE — Addendum Note (Signed)
Addended by: Hardie Pulley on: 01/03/2020 11:07 AM   Modules accepted: Orders

## 2020-01-12 ENCOUNTER — Other Ambulatory Visit: Payer: Self-pay

## 2020-01-12 ENCOUNTER — Ambulatory Visit (INDEPENDENT_AMBULATORY_CARE_PROVIDER_SITE_OTHER): Payer: PPO | Admitting: Podiatry

## 2020-01-12 DIAGNOSIS — Z9889 Other specified postprocedural states: Secondary | ICD-10-CM

## 2020-01-12 NOTE — Progress Notes (Signed)
  Subjective:  Patient ID: Jacqueline [name], female    DOB: 01-30-1966,  MRN: [id number]  Chief Complaint  Patient presents with  . Routine Post Op    Pt. states," looks and feels good." -pt denies issues tx: sx shoe   DOS: 12/16/19 Procedure: Excision of skin lesion, excision of ganglion left foot   54 y.o. female presents with the above complaint. History confirmed with patient.   Objective:  Physical Exam: no tenderness at the surgical site, no edema noted and calf supple, nontender. Incision: healed  Assessment:   1. Post-operative state     Plan:  Patient was evaluated and treated and all questions answered.  Post-operative State -Remaining sutures removed. -Ok to shower. Apply ointment daily to scar -F/u in 1 month -Monitor for signs of recurrence.  No follow-ups on file.

## 2020-01-14 ENCOUNTER — Encounter: Payer: PPO | Admitting: Podiatry

## 2020-01-15 ENCOUNTER — Encounter: Payer: Self-pay | Admitting: Podiatry

## 2020-02-15 ENCOUNTER — Ambulatory Visit (INDEPENDENT_AMBULATORY_CARE_PROVIDER_SITE_OTHER): Payer: PPO | Admitting: Podiatry

## 2020-02-15 ENCOUNTER — Other Ambulatory Visit: Payer: Self-pay

## 2020-02-15 DIAGNOSIS — Z9889 Other specified postprocedural states: Secondary | ICD-10-CM

## 2020-02-15 DIAGNOSIS — L089 Local infection of the skin and subcutaneous tissue, unspecified: Secondary | ICD-10-CM

## 2020-02-15 DIAGNOSIS — L729 Follicular cyst of the skin and subcutaneous tissue, unspecified: Secondary | ICD-10-CM

## 2020-02-15 NOTE — Progress Notes (Signed)
  Subjective:  Patient ID: Jacqueline [name], female    DOB: 08-27-1966,  MRN: [id number]  Chief Complaint  Patient presents with  . Routine Post Op    POV pt. stastes," doing good," -pt dneis pain/swellign tx: miracle cream    DOS: 12/16/19 Procedure: Excision of skin lesion, excision of ganglion left foot   54 y.o. female presents with the above complaint. History confirmed with patient.   Objective:  Physical Exam: no tenderness at the surgical site, no edema noted and calf supple, nontender. Incision: healed  Assessment:   1. Post-operative state   2. Infected cyst of skin     Plan:  Patient was evaluated and treated and all questions answered.  Post-operative State -No pain, incision well healed. No post-op issues or concerns. F/u PRN  Return if symptoms worsen or fail to improve.

## 2020-02-23 DIAGNOSIS — D225 Melanocytic nevi of trunk: Secondary | ICD-10-CM | POA: Diagnosis not present

## 2020-02-23 DIAGNOSIS — L4050 Arthropathic psoriasis, unspecified: Secondary | ICD-10-CM | POA: Diagnosis not present

## 2020-02-23 DIAGNOSIS — L400 Psoriasis vulgaris: Secondary | ICD-10-CM | POA: Diagnosis not present

## 2020-03-18 DIAGNOSIS — M05741 Rheumatoid arthritis with rheumatoid factor of right hand without organ or systems involvement: Secondary | ICD-10-CM | POA: Diagnosis not present

## 2020-03-18 DIAGNOSIS — M05742 Rheumatoid arthritis with rheumatoid factor of left hand without organ or systems involvement: Secondary | ICD-10-CM | POA: Diagnosis not present

## 2020-03-18 DIAGNOSIS — Z1231 Encounter for screening mammogram for malignant neoplasm of breast: Secondary | ICD-10-CM | POA: Diagnosis not present

## 2020-03-18 DIAGNOSIS — M1993 Secondary osteoarthritis, unspecified site: Secondary | ICD-10-CM | POA: Diagnosis not present

## 2020-03-18 DIAGNOSIS — Z6835 Body mass index (BMI) 35.0-35.9, adult: Secondary | ICD-10-CM | POA: Diagnosis not present

## 2020-03-18 DIAGNOSIS — E785 Hyperlipidemia, unspecified: Secondary | ICD-10-CM | POA: Diagnosis not present

## 2020-05-30 DIAGNOSIS — Z1231 Encounter for screening mammogram for malignant neoplasm of breast: Secondary | ICD-10-CM | POA: Diagnosis not present

## 2020-07-01 DIAGNOSIS — M05742 Rheumatoid arthritis with rheumatoid factor of left hand without organ or systems involvement: Secondary | ICD-10-CM | POA: Diagnosis not present

## 2020-07-01 DIAGNOSIS — M1993 Secondary osteoarthritis, unspecified site: Secondary | ICD-10-CM | POA: Diagnosis not present

## 2020-07-01 DIAGNOSIS — Z1331 Encounter for screening for depression: Secondary | ICD-10-CM | POA: Diagnosis not present

## 2020-07-01 DIAGNOSIS — Z79899 Other long term (current) drug therapy: Secondary | ICD-10-CM | POA: Diagnosis not present

## 2020-07-01 DIAGNOSIS — M05741 Rheumatoid arthritis with rheumatoid factor of right hand without organ or systems involvement: Secondary | ICD-10-CM | POA: Diagnosis not present

## 2020-07-01 DIAGNOSIS — E785 Hyperlipidemia, unspecified: Secondary | ICD-10-CM | POA: Diagnosis not present

## 2020-08-23 DIAGNOSIS — L299 Pruritus, unspecified: Secondary | ICD-10-CM | POA: Diagnosis not present

## 2020-08-23 DIAGNOSIS — D225 Melanocytic nevi of trunk: Secondary | ICD-10-CM | POA: Diagnosis not present

## 2020-08-23 DIAGNOSIS — B351 Tinea unguium: Secondary | ICD-10-CM | POA: Diagnosis not present

## 2020-08-23 DIAGNOSIS — L400 Psoriasis vulgaris: Secondary | ICD-10-CM | POA: Diagnosis not present

## 2020-08-23 DIAGNOSIS — L918 Other hypertrophic disorders of the skin: Secondary | ICD-10-CM | POA: Diagnosis not present

## 2020-10-07 DIAGNOSIS — Z23 Encounter for immunization: Secondary | ICD-10-CM | POA: Diagnosis not present

## 2020-10-07 DIAGNOSIS — M1993 Secondary osteoarthritis, unspecified site: Secondary | ICD-10-CM | POA: Diagnosis not present

## 2020-10-07 DIAGNOSIS — Z6834 Body mass index (BMI) 34.0-34.9, adult: Secondary | ICD-10-CM | POA: Diagnosis not present

## 2020-10-07 DIAGNOSIS — M05742 Rheumatoid arthritis with rheumatoid factor of left hand without organ or systems involvement: Secondary | ICD-10-CM | POA: Diagnosis not present

## 2020-10-07 DIAGNOSIS — E785 Hyperlipidemia, unspecified: Secondary | ICD-10-CM | POA: Diagnosis not present

## 2020-10-07 DIAGNOSIS — M05741 Rheumatoid arthritis with rheumatoid factor of right hand without organ or systems involvement: Secondary | ICD-10-CM | POA: Diagnosis not present

## 2021-01-06 DIAGNOSIS — M05742 Rheumatoid arthritis with rheumatoid factor of left hand without organ or systems involvement: Secondary | ICD-10-CM | POA: Diagnosis not present

## 2021-01-06 DIAGNOSIS — M05741 Rheumatoid arthritis with rheumatoid factor of right hand without organ or systems involvement: Secondary | ICD-10-CM | POA: Diagnosis not present

## 2021-01-06 DIAGNOSIS — E785 Hyperlipidemia, unspecified: Secondary | ICD-10-CM | POA: Diagnosis not present

## 2021-01-06 DIAGNOSIS — M1993 Secondary osteoarthritis, unspecified site: Secondary | ICD-10-CM | POA: Diagnosis not present

## 2021-02-21 DIAGNOSIS — L400 Psoriasis vulgaris: Secondary | ICD-10-CM | POA: Diagnosis not present

## 2021-02-21 DIAGNOSIS — L299 Pruritus, unspecified: Secondary | ICD-10-CM | POA: Diagnosis not present

## 2021-02-21 DIAGNOSIS — B351 Tinea unguium: Secondary | ICD-10-CM | POA: Diagnosis not present

## 2021-03-22 DIAGNOSIS — M25512 Pain in left shoulder: Secondary | ICD-10-CM | POA: Diagnosis not present

## 2021-03-22 DIAGNOSIS — M25511 Pain in right shoulder: Secondary | ICD-10-CM | POA: Diagnosis not present

## 2021-04-14 DIAGNOSIS — M05742 Rheumatoid arthritis with rheumatoid factor of left hand without organ or systems involvement: Secondary | ICD-10-CM | POA: Diagnosis not present

## 2021-04-14 DIAGNOSIS — Z1231 Encounter for screening mammogram for malignant neoplasm of breast: Secondary | ICD-10-CM | POA: Diagnosis not present

## 2021-04-14 DIAGNOSIS — E785 Hyperlipidemia, unspecified: Secondary | ICD-10-CM | POA: Diagnosis not present

## 2021-04-14 DIAGNOSIS — M05741 Rheumatoid arthritis with rheumatoid factor of right hand without organ or systems involvement: Secondary | ICD-10-CM | POA: Diagnosis not present

## 2021-04-14 DIAGNOSIS — M1993 Secondary osteoarthritis, unspecified site: Secondary | ICD-10-CM | POA: Diagnosis not present

## 2021-04-18 DIAGNOSIS — Z111 Encounter for screening for respiratory tuberculosis: Secondary | ICD-10-CM | POA: Diagnosis not present

## 2021-04-18 DIAGNOSIS — L400 Psoriasis vulgaris: Secondary | ICD-10-CM | POA: Diagnosis not present

## 2021-05-03 DIAGNOSIS — M25512 Pain in left shoulder: Secondary | ICD-10-CM | POA: Diagnosis not present

## 2021-05-23 DIAGNOSIS — L400 Psoriasis vulgaris: Secondary | ICD-10-CM | POA: Diagnosis not present

## 2021-05-23 DIAGNOSIS — B351 Tinea unguium: Secondary | ICD-10-CM | POA: Diagnosis not present

## 2021-06-02 DIAGNOSIS — Z1231 Encounter for screening mammogram for malignant neoplasm of breast: Secondary | ICD-10-CM | POA: Diagnosis not present

## 2021-07-21 DIAGNOSIS — M1993 Secondary osteoarthritis, unspecified site: Secondary | ICD-10-CM | POA: Diagnosis not present

## 2021-07-21 DIAGNOSIS — M05741 Rheumatoid arthritis with rheumatoid factor of right hand without organ or systems involvement: Secondary | ICD-10-CM | POA: Diagnosis not present

## 2021-07-21 DIAGNOSIS — Z79899 Other long term (current) drug therapy: Secondary | ICD-10-CM | POA: Diagnosis not present

## 2021-07-21 DIAGNOSIS — E785 Hyperlipidemia, unspecified: Secondary | ICD-10-CM | POA: Diagnosis not present

## 2021-07-21 DIAGNOSIS — M05742 Rheumatoid arthritis with rheumatoid factor of left hand without organ or systems involvement: Secondary | ICD-10-CM | POA: Diagnosis not present

## 2021-07-21 DIAGNOSIS — Z1331 Encounter for screening for depression: Secondary | ICD-10-CM | POA: Diagnosis not present

## 2021-08-22 DIAGNOSIS — L400 Psoriasis vulgaris: Secondary | ICD-10-CM | POA: Diagnosis not present

## 2021-08-22 DIAGNOSIS — D225 Melanocytic nevi of trunk: Secondary | ICD-10-CM | POA: Diagnosis not present

## 2021-08-22 DIAGNOSIS — B351 Tinea unguium: Secondary | ICD-10-CM | POA: Diagnosis not present

## 2021-10-16 DIAGNOSIS — J069 Acute upper respiratory infection, unspecified: Secondary | ICD-10-CM | POA: Diagnosis not present

## 2021-10-20 DIAGNOSIS — M05742 Rheumatoid arthritis with rheumatoid factor of left hand without organ or systems involvement: Secondary | ICD-10-CM | POA: Diagnosis not present

## 2021-10-20 DIAGNOSIS — E785 Hyperlipidemia, unspecified: Secondary | ICD-10-CM | POA: Diagnosis not present

## 2021-10-20 DIAGNOSIS — Z79899 Other long term (current) drug therapy: Secondary | ICD-10-CM | POA: Diagnosis not present

## 2021-10-20 DIAGNOSIS — M1993 Secondary osteoarthritis, unspecified site: Secondary | ICD-10-CM | POA: Diagnosis not present

## 2021-10-20 DIAGNOSIS — M05741 Rheumatoid arthritis with rheumatoid factor of right hand without organ or systems involvement: Secondary | ICD-10-CM | POA: Diagnosis not present

## 2021-12-07 DIAGNOSIS — M21172 Varus deformity, not elsewhere classified, left ankle: Secondary | ICD-10-CM | POA: Diagnosis not present

## 2021-12-07 DIAGNOSIS — M05772 Rheumatoid arthritis with rheumatoid factor of left ankle and foot without organ or systems involvement: Secondary | ICD-10-CM | POA: Diagnosis not present

## 2021-12-07 DIAGNOSIS — L84 Corns and callosities: Secondary | ICD-10-CM | POA: Diagnosis not present

## 2022-01-19 DIAGNOSIS — M05742 Rheumatoid arthritis with rheumatoid factor of left hand without organ or systems involvement: Secondary | ICD-10-CM | POA: Diagnosis not present

## 2022-01-19 DIAGNOSIS — E785 Hyperlipidemia, unspecified: Secondary | ICD-10-CM | POA: Diagnosis not present

## 2022-01-19 DIAGNOSIS — M1993 Secondary osteoarthritis, unspecified site: Secondary | ICD-10-CM | POA: Diagnosis not present

## 2022-01-19 DIAGNOSIS — M05741 Rheumatoid arthritis with rheumatoid factor of right hand without organ or systems involvement: Secondary | ICD-10-CM | POA: Diagnosis not present

## 2022-01-19 DIAGNOSIS — Z79891 Long term (current) use of opiate analgesic: Secondary | ICD-10-CM | POA: Diagnosis not present

## 2022-02-16 DIAGNOSIS — L853 Xerosis cutis: Secondary | ICD-10-CM | POA: Diagnosis not present

## 2022-02-16 DIAGNOSIS — L299 Pruritus, unspecified: Secondary | ICD-10-CM | POA: Diagnosis not present

## 2022-02-16 DIAGNOSIS — L400 Psoriasis vulgaris: Secondary | ICD-10-CM | POA: Diagnosis not present

## 2022-02-28 DIAGNOSIS — M25512 Pain in left shoulder: Secondary | ICD-10-CM | POA: Diagnosis not present

## 2022-03-01 DIAGNOSIS — M05741 Rheumatoid arthritis with rheumatoid factor of right hand without organ or systems involvement: Secondary | ICD-10-CM | POA: Diagnosis not present

## 2022-03-01 DIAGNOSIS — M05742 Rheumatoid arthritis with rheumatoid factor of left hand without organ or systems involvement: Secondary | ICD-10-CM | POA: Diagnosis not present

## 2022-03-12 DIAGNOSIS — Z7952 Long term (current) use of systemic steroids: Secondary | ICD-10-CM | POA: Diagnosis not present

## 2022-03-12 DIAGNOSIS — M130 Polyarthritis, unspecified: Secondary | ICD-10-CM | POA: Diagnosis not present

## 2022-03-12 DIAGNOSIS — M79642 Pain in left hand: Secondary | ICD-10-CM | POA: Diagnosis not present

## 2022-03-12 DIAGNOSIS — M05849 Other rheumatoid arthritis with rheumatoid factor of unspecified hand: Secondary | ICD-10-CM | POA: Diagnosis not present

## 2022-03-12 DIAGNOSIS — M25512 Pain in left shoulder: Secondary | ICD-10-CM | POA: Diagnosis not present

## 2022-03-12 DIAGNOSIS — M79641 Pain in right hand: Secondary | ICD-10-CM | POA: Diagnosis not present

## 2022-03-12 DIAGNOSIS — Z1159 Encounter for screening for other viral diseases: Secondary | ICD-10-CM | POA: Diagnosis not present

## 2022-03-12 DIAGNOSIS — M25521 Pain in right elbow: Secondary | ICD-10-CM | POA: Diagnosis not present

## 2022-03-12 DIAGNOSIS — M25522 Pain in left elbow: Secondary | ICD-10-CM | POA: Diagnosis not present

## 2022-03-12 DIAGNOSIS — M25529 Pain in unspecified elbow: Secondary | ICD-10-CM | POA: Diagnosis not present

## 2022-03-24 DIAGNOSIS — R7611 Nonspecific reaction to tuberculin skin test without active tuberculosis: Secondary | ICD-10-CM | POA: Diagnosis not present

## 2022-03-27 DIAGNOSIS — R7611 Nonspecific reaction to tuberculin skin test without active tuberculosis: Secondary | ICD-10-CM | POA: Diagnosis not present

## 2022-03-27 DIAGNOSIS — Z96611 Presence of right artificial shoulder joint: Secondary | ICD-10-CM | POA: Diagnosis not present

## 2022-05-04 DIAGNOSIS — Z1231 Encounter for screening mammogram for malignant neoplasm of breast: Secondary | ICD-10-CM | POA: Diagnosis not present

## 2022-05-04 DIAGNOSIS — M1993 Secondary osteoarthritis, unspecified site: Secondary | ICD-10-CM | POA: Diagnosis not present

## 2022-05-04 DIAGNOSIS — Z79891 Long term (current) use of opiate analgesic: Secondary | ICD-10-CM | POA: Diagnosis not present

## 2022-05-04 DIAGNOSIS — M05742 Rheumatoid arthritis with rheumatoid factor of left hand without organ or systems involvement: Secondary | ICD-10-CM | POA: Diagnosis not present

## 2022-05-04 DIAGNOSIS — M05741 Rheumatoid arthritis with rheumatoid factor of right hand without organ or systems involvement: Secondary | ICD-10-CM | POA: Diagnosis not present

## 2022-05-04 DIAGNOSIS — E785 Hyperlipidemia, unspecified: Secondary | ICD-10-CM | POA: Diagnosis not present

## 2022-06-06 DIAGNOSIS — Z1231 Encounter for screening mammogram for malignant neoplasm of breast: Secondary | ICD-10-CM | POA: Diagnosis not present

## 2022-08-10 DIAGNOSIS — Z23 Encounter for immunization: Secondary | ICD-10-CM | POA: Diagnosis not present

## 2022-08-10 DIAGNOSIS — E785 Hyperlipidemia, unspecified: Secondary | ICD-10-CM | POA: Diagnosis not present

## 2022-08-10 DIAGNOSIS — Z1331 Encounter for screening for depression: Secondary | ICD-10-CM | POA: Diagnosis not present

## 2022-08-10 DIAGNOSIS — M05741 Rheumatoid arthritis with rheumatoid factor of right hand without organ or systems involvement: Secondary | ICD-10-CM | POA: Diagnosis not present

## 2022-08-10 DIAGNOSIS — M05742 Rheumatoid arthritis with rheumatoid factor of left hand without organ or systems involvement: Secondary | ICD-10-CM | POA: Diagnosis not present

## 2022-08-10 DIAGNOSIS — Z79891 Long term (current) use of opiate analgesic: Secondary | ICD-10-CM | POA: Diagnosis not present

## 2022-08-10 DIAGNOSIS — Z6836 Body mass index (BMI) 36.0-36.9, adult: Secondary | ICD-10-CM | POA: Diagnosis not present

## 2022-08-10 DIAGNOSIS — M1993 Secondary osteoarthritis, unspecified site: Secondary | ICD-10-CM | POA: Diagnosis not present

## 2022-10-06 DIAGNOSIS — L299 Pruritus, unspecified: Secondary | ICD-10-CM | POA: Diagnosis not present

## 2022-10-06 DIAGNOSIS — L400 Psoriasis vulgaris: Secondary | ICD-10-CM | POA: Diagnosis not present

## 2022-10-06 DIAGNOSIS — D485 Neoplasm of uncertain behavior of skin: Secondary | ICD-10-CM | POA: Diagnosis not present

## 2022-10-08 DIAGNOSIS — L400 Psoriasis vulgaris: Secondary | ICD-10-CM | POA: Diagnosis not present

## 2022-10-08 DIAGNOSIS — Z111 Encounter for screening for respiratory tuberculosis: Secondary | ICD-10-CM | POA: Diagnosis not present

## 2022-10-13 DIAGNOSIS — D485 Neoplasm of uncertain behavior of skin: Secondary | ICD-10-CM | POA: Diagnosis not present

## 2022-11-07 DIAGNOSIS — M21172 Varus deformity, not elsewhere classified, left ankle: Secondary | ICD-10-CM | POA: Diagnosis not present

## 2022-11-07 DIAGNOSIS — M79672 Pain in left foot: Secondary | ICD-10-CM | POA: Diagnosis not present

## 2022-11-16 DIAGNOSIS — E785 Hyperlipidemia, unspecified: Secondary | ICD-10-CM | POA: Diagnosis not present

## 2022-11-16 DIAGNOSIS — Z6836 Body mass index (BMI) 36.0-36.9, adult: Secondary | ICD-10-CM | POA: Diagnosis not present

## 2022-11-16 DIAGNOSIS — M05741 Rheumatoid arthritis with rheumatoid factor of right hand without organ or systems involvement: Secondary | ICD-10-CM | POA: Diagnosis not present

## 2022-11-16 DIAGNOSIS — M05742 Rheumatoid arthritis with rheumatoid factor of left hand without organ or systems involvement: Secondary | ICD-10-CM | POA: Diagnosis not present

## 2022-11-16 DIAGNOSIS — M1993 Secondary osteoarthritis, unspecified site: Secondary | ICD-10-CM | POA: Diagnosis not present

## 2022-12-21 DIAGNOSIS — R0602 Shortness of breath: Secondary | ICD-10-CM | POA: Diagnosis not present

## 2022-12-21 DIAGNOSIS — E785 Hyperlipidemia, unspecified: Secondary | ICD-10-CM | POA: Diagnosis not present

## 2022-12-21 DIAGNOSIS — Q6632 Other congenital varus deformities of feet, left foot: Secondary | ICD-10-CM | POA: Diagnosis not present

## 2022-12-21 DIAGNOSIS — Z6834 Body mass index (BMI) 34.0-34.9, adult: Secondary | ICD-10-CM | POA: Diagnosis not present

## 2022-12-21 DIAGNOSIS — Z4789 Encounter for other orthopedic aftercare: Secondary | ICD-10-CM | POA: Diagnosis not present

## 2022-12-21 DIAGNOSIS — R0902 Hypoxemia: Secondary | ICD-10-CM | POA: Diagnosis not present

## 2022-12-21 DIAGNOSIS — G8918 Other acute postprocedural pain: Secondary | ICD-10-CM | POA: Diagnosis not present

## 2022-12-21 DIAGNOSIS — M21172 Varus deformity, not elsewhere classified, left ankle: Secondary | ICD-10-CM | POA: Diagnosis not present

## 2022-12-21 DIAGNOSIS — M79672 Pain in left foot: Secondary | ICD-10-CM | POA: Diagnosis not present

## 2022-12-21 DIAGNOSIS — Z79899 Other long term (current) drug therapy: Secondary | ICD-10-CM | POA: Diagnosis not present

## 2022-12-21 DIAGNOSIS — Z885 Allergy status to narcotic agent status: Secondary | ICD-10-CM | POA: Diagnosis not present

## 2022-12-21 DIAGNOSIS — J9811 Atelectasis: Secondary | ICD-10-CM | POA: Diagnosis not present

## 2022-12-21 DIAGNOSIS — E669 Obesity, unspecified: Secondary | ICD-10-CM | POA: Diagnosis not present

## 2022-12-21 DIAGNOSIS — R918 Other nonspecific abnormal finding of lung field: Secondary | ICD-10-CM | POA: Diagnosis not present

## 2022-12-22 DIAGNOSIS — E785 Hyperlipidemia, unspecified: Secondary | ICD-10-CM | POA: Diagnosis not present

## 2022-12-22 DIAGNOSIS — Z4789 Encounter for other orthopedic aftercare: Secondary | ICD-10-CM | POA: Diagnosis not present

## 2022-12-22 DIAGNOSIS — R0902 Hypoxemia: Secondary | ICD-10-CM | POA: Diagnosis not present

## 2023-01-02 DIAGNOSIS — Z4789 Encounter for other orthopedic aftercare: Secondary | ICD-10-CM | POA: Diagnosis not present

## 2023-01-16 DIAGNOSIS — M21172 Varus deformity, not elsewhere classified, left ankle: Secondary | ICD-10-CM | POA: Diagnosis not present

## 2023-01-16 DIAGNOSIS — Z4789 Encounter for other orthopedic aftercare: Secondary | ICD-10-CM | POA: Diagnosis not present

## 2023-01-16 DIAGNOSIS — Z91199 Patient's noncompliance with other medical treatment and regimen due to unspecified reason: Secondary | ICD-10-CM | POA: Diagnosis not present

## 2023-01-29 DIAGNOSIS — Z4789 Encounter for other orthopedic aftercare: Secondary | ICD-10-CM | POA: Diagnosis not present

## 2023-02-13 DIAGNOSIS — Z4789 Encounter for other orthopedic aftercare: Secondary | ICD-10-CM | POA: Diagnosis not present

## 2023-02-15 DIAGNOSIS — M05741 Rheumatoid arthritis with rheumatoid factor of right hand without organ or systems involvement: Secondary | ICD-10-CM | POA: Diagnosis not present

## 2023-02-15 DIAGNOSIS — E785 Hyperlipidemia, unspecified: Secondary | ICD-10-CM | POA: Diagnosis not present

## 2023-02-15 DIAGNOSIS — M05742 Rheumatoid arthritis with rheumatoid factor of left hand without organ or systems involvement: Secondary | ICD-10-CM | POA: Diagnosis not present

## 2023-02-15 DIAGNOSIS — Z6836 Body mass index (BMI) 36.0-36.9, adult: Secondary | ICD-10-CM | POA: Diagnosis not present

## 2023-02-15 DIAGNOSIS — N3281 Overactive bladder: Secondary | ICD-10-CM | POA: Diagnosis not present

## 2023-02-15 DIAGNOSIS — J301 Allergic rhinitis due to pollen: Secondary | ICD-10-CM | POA: Diagnosis not present

## 2023-02-15 DIAGNOSIS — M8589 Other specified disorders of bone density and structure, multiple sites: Secondary | ICD-10-CM | POA: Diagnosis not present

## 2023-02-15 DIAGNOSIS — M1993 Secondary osteoarthritis, unspecified site: Secondary | ICD-10-CM | POA: Diagnosis not present

## 2023-03-13 DIAGNOSIS — Z4789 Encounter for other orthopedic aftercare: Secondary | ICD-10-CM | POA: Diagnosis not present

## 2023-04-05 DIAGNOSIS — L400 Psoriasis vulgaris: Secondary | ICD-10-CM | POA: Diagnosis not present

## 2023-04-05 DIAGNOSIS — L853 Xerosis cutis: Secondary | ICD-10-CM | POA: Diagnosis not present

## 2023-05-08 DIAGNOSIS — D225 Melanocytic nevi of trunk: Secondary | ICD-10-CM | POA: Diagnosis not present

## 2023-05-08 DIAGNOSIS — L400 Psoriasis vulgaris: Secondary | ICD-10-CM | POA: Diagnosis not present

## 2023-05-24 DIAGNOSIS — Z6837 Body mass index (BMI) 37.0-37.9, adult: Secondary | ICD-10-CM | POA: Diagnosis not present

## 2023-05-24 DIAGNOSIS — E785 Hyperlipidemia, unspecified: Secondary | ICD-10-CM | POA: Diagnosis not present

## 2023-05-24 DIAGNOSIS — N3281 Overactive bladder: Secondary | ICD-10-CM | POA: Diagnosis not present

## 2023-05-24 DIAGNOSIS — M05741 Rheumatoid arthritis with rheumatoid factor of right hand without organ or systems involvement: Secondary | ICD-10-CM | POA: Diagnosis not present

## 2023-05-24 DIAGNOSIS — M1993 Secondary osteoarthritis, unspecified site: Secondary | ICD-10-CM | POA: Diagnosis not present

## 2023-05-24 DIAGNOSIS — J301 Allergic rhinitis due to pollen: Secondary | ICD-10-CM | POA: Diagnosis not present

## 2023-05-24 DIAGNOSIS — M05742 Rheumatoid arthritis with rheumatoid factor of left hand without organ or systems involvement: Secondary | ICD-10-CM | POA: Diagnosis not present

## 2023-05-24 DIAGNOSIS — Z1231 Encounter for screening mammogram for malignant neoplasm of breast: Secondary | ICD-10-CM | POA: Diagnosis not present

## 2023-05-27 DIAGNOSIS — Z4789 Encounter for other orthopedic aftercare: Secondary | ICD-10-CM | POA: Diagnosis not present

## 2023-06-12 DIAGNOSIS — Z1231 Encounter for screening mammogram for malignant neoplasm of breast: Secondary | ICD-10-CM | POA: Diagnosis not present

## 2023-07-11 DIAGNOSIS — Z01818 Encounter for other preprocedural examination: Secondary | ICD-10-CM | POA: Diagnosis not present

## 2023-08-16 DIAGNOSIS — Z860100 Personal history of colon polyps, unspecified: Secondary | ICD-10-CM | POA: Diagnosis not present

## 2023-08-16 DIAGNOSIS — K635 Polyp of colon: Secondary | ICD-10-CM | POA: Diagnosis not present

## 2023-08-16 DIAGNOSIS — D122 Benign neoplasm of ascending colon: Secondary | ICD-10-CM | POA: Diagnosis not present

## 2023-08-16 DIAGNOSIS — D126 Benign neoplasm of colon, unspecified: Secondary | ICD-10-CM | POA: Diagnosis not present

## 2023-08-16 DIAGNOSIS — Z860109 Personal history of other colon polyps: Secondary | ICD-10-CM | POA: Diagnosis not present

## 2023-08-16 DIAGNOSIS — M069 Rheumatoid arthritis, unspecified: Secondary | ICD-10-CM | POA: Diagnosis not present

## 2023-09-10 DIAGNOSIS — L400 Psoriasis vulgaris: Secondary | ICD-10-CM | POA: Diagnosis not present

## 2023-09-10 DIAGNOSIS — D225 Melanocytic nevi of trunk: Secondary | ICD-10-CM | POA: Diagnosis not present

## 2023-09-13 DIAGNOSIS — M05741 Rheumatoid arthritis with rheumatoid factor of right hand without organ or systems involvement: Secondary | ICD-10-CM | POA: Diagnosis not present

## 2023-09-13 DIAGNOSIS — M05742 Rheumatoid arthritis with rheumatoid factor of left hand without organ or systems involvement: Secondary | ICD-10-CM | POA: Diagnosis not present

## 2023-09-13 DIAGNOSIS — M1993 Secondary osteoarthritis, unspecified site: Secondary | ICD-10-CM | POA: Diagnosis not present

## 2023-09-13 DIAGNOSIS — Z23 Encounter for immunization: Secondary | ICD-10-CM | POA: Diagnosis not present

## 2023-09-13 DIAGNOSIS — Z6836 Body mass index (BMI) 36.0-36.9, adult: Secondary | ICD-10-CM | POA: Diagnosis not present

## 2023-09-13 DIAGNOSIS — E785 Hyperlipidemia, unspecified: Secondary | ICD-10-CM | POA: Diagnosis not present

## 2023-09-13 DIAGNOSIS — N3281 Overactive bladder: Secondary | ICD-10-CM | POA: Diagnosis not present

## 2023-11-06 DIAGNOSIS — Z9181 History of falling: Secondary | ICD-10-CM | POA: Diagnosis not present

## 2023-11-06 DIAGNOSIS — Z0000 Encounter for general adult medical examination without abnormal findings: Secondary | ICD-10-CM | POA: Diagnosis not present

## 2023-12-20 DIAGNOSIS — N3281 Overactive bladder: Secondary | ICD-10-CM | POA: Diagnosis not present

## 2023-12-20 DIAGNOSIS — M1993 Secondary osteoarthritis, unspecified site: Secondary | ICD-10-CM | POA: Diagnosis not present

## 2023-12-20 DIAGNOSIS — E785 Hyperlipidemia, unspecified: Secondary | ICD-10-CM | POA: Diagnosis not present

## 2023-12-20 DIAGNOSIS — M05742 Rheumatoid arthritis with rheumatoid factor of left hand without organ or systems involvement: Secondary | ICD-10-CM | POA: Diagnosis not present

## 2023-12-20 DIAGNOSIS — J302 Other seasonal allergic rhinitis: Secondary | ICD-10-CM | POA: Diagnosis not present

## 2023-12-20 DIAGNOSIS — M05741 Rheumatoid arthritis with rheumatoid factor of right hand without organ or systems involvement: Secondary | ICD-10-CM | POA: Diagnosis not present

## 2024-01-25 DIAGNOSIS — L299 Pruritus, unspecified: Secondary | ICD-10-CM | POA: Diagnosis not present

## 2024-01-25 DIAGNOSIS — L400 Psoriasis vulgaris: Secondary | ICD-10-CM | POA: Diagnosis not present

## 2024-01-25 DIAGNOSIS — D225 Melanocytic nevi of trunk: Secondary | ICD-10-CM | POA: Diagnosis not present

## 2024-01-28 DIAGNOSIS — Z111 Encounter for screening for respiratory tuberculosis: Secondary | ICD-10-CM | POA: Diagnosis not present

## 2024-01-28 DIAGNOSIS — L400 Psoriasis vulgaris: Secondary | ICD-10-CM | POA: Diagnosis not present

## 2024-01-28 DIAGNOSIS — R531 Weakness: Secondary | ICD-10-CM | POA: Diagnosis not present

## 2024-01-28 DIAGNOSIS — Z79899 Other long term (current) drug therapy: Secondary | ICD-10-CM | POA: Diagnosis not present

## 2024-03-20 DIAGNOSIS — N3281 Overactive bladder: Secondary | ICD-10-CM | POA: Diagnosis not present

## 2024-03-20 DIAGNOSIS — M05741 Rheumatoid arthritis with rheumatoid factor of right hand without organ or systems involvement: Secondary | ICD-10-CM | POA: Diagnosis not present

## 2024-03-20 DIAGNOSIS — E785 Hyperlipidemia, unspecified: Secondary | ICD-10-CM | POA: Diagnosis not present

## 2024-03-20 DIAGNOSIS — Z1231 Encounter for screening mammogram for malignant neoplasm of breast: Secondary | ICD-10-CM | POA: Diagnosis not present

## 2024-03-20 DIAGNOSIS — M1993 Secondary osteoarthritis, unspecified site: Secondary | ICD-10-CM | POA: Diagnosis not present

## 2024-03-20 DIAGNOSIS — M05742 Rheumatoid arthritis with rheumatoid factor of left hand without organ or systems involvement: Secondary | ICD-10-CM | POA: Diagnosis not present

## 2024-03-20 DIAGNOSIS — M8589 Other specified disorders of bone density and structure, multiple sites: Secondary | ICD-10-CM | POA: Diagnosis not present

## 2024-03-20 DIAGNOSIS — J302 Other seasonal allergic rhinitis: Secondary | ICD-10-CM | POA: Diagnosis not present

## 2024-05-16 DIAGNOSIS — L400 Psoriasis vulgaris: Secondary | ICD-10-CM | POA: Diagnosis not present

## 2024-05-16 DIAGNOSIS — L299 Pruritus, unspecified: Secondary | ICD-10-CM | POA: Diagnosis not present

## 2024-05-16 DIAGNOSIS — L821 Other seborrheic keratosis: Secondary | ICD-10-CM | POA: Diagnosis not present

## 2024-06-05 DIAGNOSIS — H2511 Age-related nuclear cataract, right eye: Secondary | ICD-10-CM | POA: Diagnosis not present

## 2024-06-05 DIAGNOSIS — H25043 Posterior subcapsular polar age-related cataract, bilateral: Secondary | ICD-10-CM | POA: Diagnosis not present

## 2024-06-05 DIAGNOSIS — H2513 Age-related nuclear cataract, bilateral: Secondary | ICD-10-CM | POA: Diagnosis not present

## 2024-06-05 DIAGNOSIS — H25013 Cortical age-related cataract, bilateral: Secondary | ICD-10-CM | POA: Diagnosis not present

## 2024-06-12 DIAGNOSIS — M8589 Other specified disorders of bone density and structure, multiple sites: Secondary | ICD-10-CM | POA: Diagnosis not present

## 2024-06-12 DIAGNOSIS — Z1231 Encounter for screening mammogram for malignant neoplasm of breast: Secondary | ICD-10-CM | POA: Diagnosis not present

## 2024-06-16 DIAGNOSIS — M85851 Other specified disorders of bone density and structure, right thigh: Secondary | ICD-10-CM | POA: Diagnosis not present

## 2024-06-16 DIAGNOSIS — Z13820 Encounter for screening for osteoporosis: Secondary | ICD-10-CM | POA: Diagnosis not present

## 2024-06-16 DIAGNOSIS — M85852 Other specified disorders of bone density and structure, left thigh: Secondary | ICD-10-CM | POA: Diagnosis not present

## 2024-06-26 DIAGNOSIS — M1993 Secondary osteoarthritis, unspecified site: Secondary | ICD-10-CM | POA: Diagnosis not present

## 2024-06-26 DIAGNOSIS — E785 Hyperlipidemia, unspecified: Secondary | ICD-10-CM | POA: Diagnosis not present

## 2024-06-26 DIAGNOSIS — J302 Other seasonal allergic rhinitis: Secondary | ICD-10-CM | POA: Diagnosis not present

## 2024-06-26 DIAGNOSIS — N3281 Overactive bladder: Secondary | ICD-10-CM | POA: Diagnosis not present

## 2024-06-26 DIAGNOSIS — Z79891 Long term (current) use of opiate analgesic: Secondary | ICD-10-CM | POA: Diagnosis not present

## 2024-06-26 DIAGNOSIS — M05741 Rheumatoid arthritis with rheumatoid factor of right hand without organ or systems involvement: Secondary | ICD-10-CM | POA: Diagnosis not present

## 2024-06-26 DIAGNOSIS — M05742 Rheumatoid arthritis with rheumatoid factor of left hand without organ or systems involvement: Secondary | ICD-10-CM | POA: Diagnosis not present

## 2024-07-31 DIAGNOSIS — H524 Presbyopia: Secondary | ICD-10-CM | POA: Diagnosis not present

## 2024-07-31 DIAGNOSIS — H26491 Other secondary cataract, right eye: Secondary | ICD-10-CM | POA: Diagnosis not present

## 2024-07-31 DIAGNOSIS — H2512 Age-related nuclear cataract, left eye: Secondary | ICD-10-CM | POA: Diagnosis not present

## 2024-07-31 DIAGNOSIS — H2511 Age-related nuclear cataract, right eye: Secondary | ICD-10-CM | POA: Diagnosis not present

## 2024-08-28 DIAGNOSIS — H5371 Glare sensitivity: Secondary | ICD-10-CM | POA: Diagnosis not present

## 2024-08-28 DIAGNOSIS — H25042 Posterior subcapsular polar age-related cataract, left eye: Secondary | ICD-10-CM | POA: Diagnosis not present

## 2024-08-28 DIAGNOSIS — H2512 Age-related nuclear cataract, left eye: Secondary | ICD-10-CM | POA: Diagnosis not present

## 2024-08-28 DIAGNOSIS — H25012 Cortical age-related cataract, left eye: Secondary | ICD-10-CM | POA: Diagnosis not present

## 2024-10-02 DIAGNOSIS — D225 Melanocytic nevi of trunk: Secondary | ICD-10-CM | POA: Diagnosis not present

## 2024-10-02 DIAGNOSIS — L400 Psoriasis vulgaris: Secondary | ICD-10-CM | POA: Diagnosis not present

## 2024-10-02 DIAGNOSIS — L299 Pruritus, unspecified: Secondary | ICD-10-CM | POA: Diagnosis not present

## 2024-10-08 DIAGNOSIS — R7301 Impaired fasting glucose: Secondary | ICD-10-CM | POA: Diagnosis not present

## 2024-10-08 DIAGNOSIS — Z23 Encounter for immunization: Secondary | ICD-10-CM | POA: Diagnosis not present

## 2024-10-08 DIAGNOSIS — M05741 Rheumatoid arthritis with rheumatoid factor of right hand without organ or systems involvement: Secondary | ICD-10-CM | POA: Diagnosis not present

## 2024-10-08 DIAGNOSIS — E785 Hyperlipidemia, unspecified: Secondary | ICD-10-CM | POA: Diagnosis not present
